# Patient Record
Sex: Male | Born: 1955 | Race: Black or African American | Hispanic: No | Marital: Married | Smoking: Former smoker
Health system: Southern US, Community
[De-identification: ages and names within clinical notes are randomized; demographics above are authoritative.]

## PROBLEM LIST (undated history)

## (undated) DIAGNOSIS — F29 Unspecified psychosis not due to a substance or known physiological condition: Secondary | ICD-10-CM

## (undated) DIAGNOSIS — R32 Unspecified urinary incontinence: Secondary | ICD-10-CM

## (undated) DIAGNOSIS — I10 Essential (primary) hypertension: Secondary | ICD-10-CM

## (undated) DIAGNOSIS — R569 Unspecified convulsions: Secondary | ICD-10-CM

---

## 2004-05-13 ENCOUNTER — Ambulatory Visit (HOSPITAL_COMMUNITY): Admission: RE | Admit: 2004-05-13 | Discharge: 2004-05-13 | Payer: Self-pay | Admitting: *Deleted

## 2008-07-29 ENCOUNTER — Ambulatory Visit (HOSPITAL_COMMUNITY): Admission: RE | Admit: 2008-07-29 | Discharge: 2008-07-29 | Payer: Self-pay | Admitting: Pulmonary Disease

## 2011-02-12 NOTE — Op Note (Signed)
NAME:  ALEISTER, LADY                         ACCOUNT NO.:  [id number]   MEDICAL RECORD NO.:  [id number]                   PATIENT TYPE:  AMB   LOCATION:  DAY                                  FACILITY:  [facility]   PHYSICIAN:  Dalia Heading, M.D.               DATE OF BIRTH:  12/28/1956   DATE OF PROCEDURE:  05/13/2004  DATE OF DISCHARGE:                                 OPERATIVE REPORT   PREOPERATIVE DIAGNOSIS:  Mass, right buttock.   POSTOPERATIVE DIAGNOSIS:  Mass, right buttock.   PROCEDURE:  Excision of mass, right buttock.   SURGEON:  Dalia Heading, M.D.   ANESTHESIA:  Local with IV sedation.   INDICATIONS FOR PROCEDURE:  The patient is a 55 year old black male with a  history of mental retardation and seizures who presents with an exophytic  mass on the right buttock.  He now comes to the operating room for excision  of the mass.  The risks and benefits of the procedure were fully explained  to the patient and caregiver, who gave informed consent for the patient.   DESCRIPTION OF PROCEDURE:  The patient was placed in the left lateral  decubitus position.  Versed 2 mg were given preoperatively for mild  sedation.  Xylocaine 1% was used for local anesthesia.  The right buttock  was prepped and draped using the usual sterile technique with Betadine.  Surgical site confirmation was performed.   An elliptical incision was made around the base of the mass.  This was taken  down to the subcutaneous tissue.  A lipomatous mass was found.  This was  sent to pathology for further evaluation.  Any bleeding was controlled using  Bovie electrocautery.  The subcutaneous layer was reapproximated using a 4-0  Vicryl interrupted suture.  The skin was closed using a 4-0 Prolene  interrupted suture.  Betadine ointment and a dry sterile dressing were  applied.   All tape and needle counts were correct at the end of the procedure.  The  patient was transferred to the PACU in stable  condition.   COMPLICATIONS:  None.   SPECIMENS:  Mass, right buttock.   ESTIMATED BLOOD LOSS:  Minimal.      ___________________________________________                                            Dalia Heading, M.D.   MAJ/MEDQ  D:  05/13/2004  T:  05/13/2004  Job:  [id number]   cc:   Ramon Dredge L. Juanetta Gosling, M.D.  [address]  [place]  [place] [address]  Fax: [phone]

## 2015-09-09 ENCOUNTER — Observation Stay (HOSPITAL_COMMUNITY)
Admission: EM | Admit: 2015-09-09 | Discharge: 2015-09-11 | Disposition: A | Discharge status: Home / Self Care | Payer: Medicaid Other | Attending: Pulmonary Disease | Admitting: Pulmonary Disease

## 2015-09-09 ENCOUNTER — Encounter (HOSPITAL_COMMUNITY): Payer: Self-pay

## 2015-09-09 ENCOUNTER — Emergency Department (HOSPITAL_COMMUNITY): Payer: Medicaid Other

## 2015-09-09 DIAGNOSIS — Z87891 Personal history of nicotine dependence: Secondary | ICD-10-CM | POA: Insufficient documentation

## 2015-09-09 DIAGNOSIS — R29810 Facial weakness: Secondary | ICD-10-CM | POA: Diagnosis not present

## 2015-09-09 DIAGNOSIS — R4182 Altered mental status, unspecified: Secondary | ICD-10-CM | POA: Diagnosis present

## 2015-09-09 DIAGNOSIS — R569 Unspecified convulsions: Secondary | ICD-10-CM

## 2015-09-09 DIAGNOSIS — G459 Transient cerebral ischemic attack, unspecified: Secondary | ICD-10-CM | POA: Diagnosis not present

## 2015-09-09 DIAGNOSIS — R32 Unspecified urinary incontinence: Secondary | ICD-10-CM

## 2015-09-09 DIAGNOSIS — N39498 Other specified urinary incontinence: Secondary | ICD-10-CM | POA: Diagnosis not present

## 2015-09-09 DIAGNOSIS — F29 Unspecified psychosis not due to a substance or known physiological condition: Secondary | ICD-10-CM | POA: Diagnosis not present

## 2015-09-09 DIAGNOSIS — Z79899 Other long term (current) drug therapy: Secondary | ICD-10-CM | POA: Diagnosis not present

## 2015-09-09 DIAGNOSIS — I10 Essential (primary) hypertension: Secondary | ICD-10-CM

## 2015-09-09 HISTORY — DX: Unspecified convulsions: R56.9

## 2015-09-09 HISTORY — DX: Unspecified psychosis not due to a substance or known physiological condition: F29

## 2015-09-09 HISTORY — DX: Unspecified urinary incontinence: R32

## 2015-09-09 HISTORY — DX: Essential (primary) hypertension: I10

## 2015-09-09 LAB — COMPREHENSIVE METABOLIC PANEL
ALBUMIN: 3.6 g/dL (ref 3.5–5.0)
ALT: 23 U/L (ref 17–63)
ANION GAP: 13 (ref 5–15)
AST: 28 U/L (ref 15–41)
Alkaline Phosphatase: 108 U/L (ref 38–126)
BUN: 11 mg/dL (ref 6–20)
CHLORIDE: 106 mmol/L (ref 101–111)
CO2: 23 mmol/L (ref 22–32)
CREATININE: 1.38 mg/dL — AB (ref 0.61–1.24)
Calcium: 8.4 mg/dL — ABNORMAL LOW (ref 8.9–10.3)
GFR calc non Af Amer: 54 mL/min — ABNORMAL LOW (ref >60)
Glucose, Bld: 135 mg/dL — ABNORMAL HIGH (ref 65–99)
Potassium: 3.6 mmol/L (ref 3.5–5.1)
SODIUM: 142 mmol/L (ref 135–145)
Total Bilirubin: 0.5 mg/dL (ref 0.3–1.2)
Total Protein: 7.1 g/dL (ref 6.5–8.1)

## 2015-09-09 LAB — URINALYSIS, ROUTINE W REFLEX MICROSCOPIC
Bilirubin Urine: NEGATIVE
GLUCOSE, UA: NEGATIVE mg/dL
HGB URINE DIPSTICK: NEGATIVE
KETONES UR: NEGATIVE mg/dL
Leukocytes, UA: NEGATIVE
Nitrite: NEGATIVE
PROTEIN: NEGATIVE mg/dL
Specific Gravity, Urine: <1.005 — ABNORMAL LOW (ref 1.005–1.030)
pH: 5.5 (ref 5.0–8.0)

## 2015-09-09 LAB — CBC
HCT: 41.1 % (ref 39.0–52.0)
Hemoglobin: 13.7 g/dL (ref 13.0–17.0)
MCH: 31.4 pg (ref 26.0–34.0)
MCHC: 33.3 g/dL (ref 30.0–36.0)
MCV: 94.1 fL (ref 78.0–100.0)
PLATELETS: 152 10*3/uL (ref 150–400)
RBC: 4.37 MIL/uL (ref 4.22–5.81)
RDW: 12.9 % (ref 11.5–15.5)
WBC: 12.3 10*3/uL — AB (ref 4.0–10.5)

## 2015-09-09 LAB — DIFFERENTIAL
BASOS PCT: 0 %
Basophils Absolute: 0 10*3/uL (ref 0.0–0.1)
Eosinophils Absolute: 0.1 10*3/uL (ref 0.0–0.7)
Eosinophils Relative: 1 %
Lymphocytes Relative: 10 %
Lymphs Abs: 1.3 10*3/uL (ref 0.7–4.0)
MONO ABS: 0.5 10*3/uL (ref 0.1–1.0)
Monocytes Relative: 4 %
NEUTROS ABS: 10.4 10*3/uL — AB (ref 1.7–7.7)
NEUTROS PCT: 85 %

## 2015-09-09 LAB — APTT: APTT: 22 s — AB (ref 24–37)

## 2015-09-09 LAB — AMMONIA: Ammonia: 31 umol/L (ref 9–35)

## 2015-09-09 LAB — RAPID URINE DRUG SCREEN, HOSP PERFORMED
Amphetamines: NOT DETECTED
BARBITURATES: NOT DETECTED
BENZODIAZEPINES: NOT DETECTED
COCAINE: NOT DETECTED
Opiates: NOT DETECTED
TETRAHYDROCANNABINOL: NOT DETECTED

## 2015-09-09 LAB — ETHANOL

## 2015-09-09 LAB — PROTIME-INR
INR: 1.17 (ref 0.00–1.49)
PROTHROMBIN TIME: 15.1 s (ref 11.6–15.2)

## 2015-09-09 MED ORDER — STROKE: EARLY STAGES OF RECOVERY BOOK
Freq: Once | Status: AC
  Administered 2015-09-10: 11:00:00
  Filled 2015-09-09: qty 1

## 2015-09-09 MED ORDER — SODIUM CHLORIDE 0.9 % IV SOLN
Freq: Once | INTRAVENOUS | Status: AC
Start: 2015-09-09 — End: 2015-09-09
  Administered 2015-09-09: 16:00:00 via INTRAVENOUS

## 2015-09-09 MED ORDER — ENOXAPARIN SODIUM 40 MG/0.4ML ~~LOC~~ SOLN
40.0000 mg | SUBCUTANEOUS | Status: DC
  Administered 2015-09-10 – 2015-09-11 (×2): 40 mg via SUBCUTANEOUS
  Filled 2015-09-09 (×2): qty 0.4

## 2015-09-09 MED ORDER — ASPIRIN 325 MG PO TABS
325.0000 mg | ORAL_TABLET | Freq: Every day | ORAL | Status: DC
  Administered 2015-09-10 – 2015-09-11 (×2): 325 mg via ORAL
  Filled 2015-09-09 (×2): qty 1

## 2015-09-09 MED ORDER — ASPIRIN 300 MG RE SUPP: 300.0000 mg | Freq: Every day | RECTAL | Status: DC

## 2015-09-09 NOTE — ED Notes (Signed)
Per ems cbg was 156.  Dr. Hyacinth MeekerMiller at bedside.

## 2015-09-09 NOTE — ED Provider Notes (Signed)
CSN: [id number]     Arrival date & time 09/09/15  1522 History   First MD Initiated Contact with Patient 09/09/15 1531     Chief Complaint  Patient presents with  . Altered Mental Status     (Consider location/radiation/quality/duration/timing/severity/associated sxs/prior Treatment) HPI Comments: The patient is a 59 year old male, he has a history of high blood pressure, psychosis, convulsions, he currently lives in an assisted care facility where staff reported that he had onset of facial droop and speaking softer than normal. They noticed this for the first time one hour ago, when the paramedics arrived they found the patient slumped over to his side Ryan Morton Fus appeared to have some facial droop however when I asked him to get up the facial droop immediately disappeared. He was unable to ambulate because he was off balance, the staff states that is his baseline, they also reported that the speech being slurred is his baseline. There has been no other abnormal findings per the staff including fevers, vomiting, coughing and the patient has no complaints. Paramedics report a blood sugar of 156. The symptoms occurred just prior to arrival, they were persistent, they resolved spontaneously, there were not associated with any other weakness numbness or seizures.  The history is provided by the patient and the EMS personnel.    Past Medical History  Diagnosis Date  . Hypertension   . Convulsions (HCC)   . Psychosis   . Urinary incontinence    History reviewed. No pertinent past surgical history. No family history on file. Social History  Substance Use Topics  . Smoking status: Former Games developer  . Smokeless tobacco: None  . Alcohol Use: No     Comment: former    Review of Systems  All other systems reviewed and are negative.     Allergies  Review of patient's allergies indicates no known allergies.  Home Medications   Prior to Admission medications   Medication Sig Start Date End  Date Taking? Authorizing Provider  amLODipine (NORVASC) 10 MG tablet Take 10 mg by mouth daily.   Yes Historical Provider, MD  ammonium lactate (LAC-HYDRIN) 12 % lotion Apply 1 application topically as needed for dry skin.   Yes Historical Provider, MD  atenolol (TENORMIN) 100 MG tablet Take 100 mg by mouth daily.   Yes Historical Provider, MD  benztropine (COGENTIN) 1 MG tablet Take 1 mg by mouth 2 (two) times daily.   Yes Historical Provider, MD  carbamazepine (TEGRETOL XR) 400 MG 12 hr tablet Take 400 mg by mouth 2 (two) times daily.   Yes Historical Provider, MD  furosemide (LASIX) 40 MG tablet Take 40 mg by mouth daily.   Yes Historical Provider, MD  lisinopril (PRINIVIL,ZESTRIL) 40 MG tablet Take 40 mg by mouth daily.   Yes Historical Provider, MD  OLANZapine (ZYPREXA) 5 MG tablet Take 5 mg by mouth daily.   Yes Historical Provider, MD  oxybutynin (DITROPAN) 5 MG tablet Take 5 mg by mouth 2 (two) times daily.   Yes Historical Provider, MD  pantoprazole (PROTONIX) 40 MG tablet Take 40 mg by mouth daily.   Yes Historical Provider, MD  potassium chloride (K-DUR) 10 MEQ tablet Take 20 mEq by mouth 4 (four) times daily.   Yes Historical Provider, MD   BP 133/85 mmHg  Pulse 77  Temp(Src) 98.4 F (36.9 C) (Oral)  Resp 18  Ht  (1.753 m)  SpO2 93% Physical Exam  Constitutional: He appears well-developed and well-nourished. No distress.  HENT:  Head:  Normocephalic and atraumatic.  Mouth/Throat: Oropharynx is clear and moist. No oropharyngeal exudate.  Dry mouth  Eyes: Conjunctivae and EOM are normal. Pupils are equal, round, and reactive to light. Right eye exhibits no discharge. Left eye exhibits no discharge. No scleral icterus.  Neck: Normal range of motion. Neck supple. No JVD present. No thyromegaly present.  Cardiovascular: Normal rate, regular rhythm, normal heart sounds and intact distal pulses.  Exam reveals no gallop and no friction rub.   No murmur heard. Pulmonary/Chest:  Effort normal and breath sounds normal. No respiratory distress. He has no wheezes. He has no rales.  Abdominal: Soft. Bowel sounds are normal. He exhibits no distension and no mass. There is no tenderness.  Musculoskeletal: Normal range of motion. He exhibits no edema or tenderness.  Lymphadenopathy:    He has no cervical adenopathy.  Neurological: He is alert. Coordination normal.  The patient has asterixis, he is slurring his speech, he has no facial droop, he is able to follow commands, has frequent convulsive myoclonic jerking movements of the upper extremities when he tries to use them, cranial nerves III through XII otherwise intact  Skin: Skin is warm and dry. No rash noted. No erythema.  Psychiatric: He has a normal mood and affect. His behavior is normal.  Nursing note and vitals reviewed.   ED Course  Procedures (including critical care time) Labs Review Labs Reviewed  APTT - Abnormal; Notable for the following:    aPTT 22 (*)    All other components within normal limits  CBC - Abnormal; Notable for the following:    WBC 12.3 (*)    All other components within normal limits  DIFFERENTIAL - Abnormal; Notable for the following:    Neutro Abs 10.4 (*)    All other components within normal limits  COMPREHENSIVE METABOLIC PANEL - Abnormal; Notable for the following:    Glucose, Bld 135 (*)    Creatinine, Ser 1.38 (*)    Calcium 8.4 (*)    GFR calc non Af Amer 54 (*)    All other components within normal limits  URINALYSIS, ROUTINE W REFLEX MICROSCOPIC (NOT AT [facility]) - Abnormal; Notable for the following:    Specific Gravity, Urine <1.005 (*)    All other components within normal limits  ETHANOL  PROTIME-INR  URINE RAPID DRUG SCREEN, HOSP PERFORMED  AMMONIA  I-STAT CHEM 8, ED  I-STAT TROPOININ, ED    Imaging Review Ct Head Wo Contrast  09/09/2015  CLINICAL DATA:  Left-sided facial droop beginning at 2:45 p.m. today. Slurred speech EXAM: CT HEAD WITHOUT CONTRAST  TECHNIQUE: Contiguous axial images were obtained from the base of the skull through the vertex without intravenous contrast. COMPARISON:  None. FINDINGS: Moderate cerebellar atrophy is present. Left temporal encephalomalacia is noted. The study is mildly degraded by patient motion. Mild subcortical white matter hypoattenuation is present. The basal ganglia and insular ribbon is intact bilaterally. No acute infarct, hemorrhage, or mass lesion is present. The ventricles are of normal size. No significant extra-axial fluid collection is present. The paranasal sinuses and mastoid air cells are clear. The calvarium is intact. IMPRESSION: 1. Moderate cerebellar atrophy. An this is most commonly seen in setting of chronic anti epileptic therapy. Alcohol abuse can give a similar appearance. 2. 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 Electronically Signed   By: Marin Robertshristopher  Mattern M.D.   On: 09/09/2015 17:04   I have personally reviewed and evaluated these images  and lab results as part of my medical decision-making.   EKG Interpretation   Date/Time:  Tuesday September 09 2015 15:44:41 EST Ventricular Rate:  79 PR Interval:  221 QRS Duration: 123 QT Interval:  381 QTC Calculation: 437 R Axis:   33 Text Interpretation:  Sinus rhythm Prolonged PR interval Nonspecific  intraventricular conduction delay Inferior infarct, old No old tracing to  compare Confirmed by Justice Milliron  MD, Madissen Wyse ([id number]) on 09/09/2015 4:13:19 PM      MDM   Final diagnoses:  None    We'll discuss with nursing facility regarding the patient's baseline though it appears that many of the things we are seeing today are his baseline. He does appear dehydrated that this could be related to his medications as he is on antipsychotics and an antihistamine both of which can cause anticholinergism. We'll proceed with CT scan of the brain, labs, IV fluids.  According to staff, he can't feed  himself, dress himself and is total care - at 1:30 - they found him slumped over in a chair.  D/w Neurologist Dr. Gerilyn Pilgrim - reccomends admission D/w hospitalist who will admit  n obovious findings on imaging / labs.  Eber Hong, MD 09/09/15 [phone]

## 2015-09-09 NOTE — H&P (Signed)
Patient Demographics  Ryan Morton, is a 59 y.o. male  MRN: [id number]   DOB - 01-07-1956  Admit Date - 09/09/2015  Outpatient Primary MD for the patient is No primary care provider on file.   With History of -  Past Medical History  Diagnosis Date  . Hypertension   . Convulsions (HCC)   . Psychosis   . Urinary incontinence       History reviewed. No pertinent past surgical history.  in for   Chief Complaint  Patient presents with  . Altered Mental Status     HPI  Ryan Morton  is a 59 y.o. 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 as per caregiver), lives at the group home, called to evaluate patient for an episode of left facial droop, and altered mental status lasted for 30 minutes to 1 hour, caregiver at bedside who is taking care of patient last 16 years, report patient is wheelchair dependent, with incontinence, had an episode where he was sitting on the chair, noticed to have left facial droop, was delayed in answering questions, and appears to be confused, symptoms resolved by time patient was in ED, no evidence of upper or lower extremity weakness, patient denies tingling or numbness, no seizure-like activity noticed, no urinary or stool incontinence, CBG on presentation 156, CT head with no evidence of stroke, a physician discussed with the neurology, commended patient to be admitted for TIA workup, I was called to admit.    Review of Systems    In addition to the HPI above,  No Fever-chills, No Headache, No changes with Vision or hearing, No problems swallowing food or Liquids, No Chest pain, Cough or Shortness of Breath, No Abdominal pain, No Nausea or Vommitting, Bowel movements are regular, No Blood in stool or Urine, No dysuria, No new skin rashes or bruises, No new joints pains-aches,  No new weakness, tingling,  numbness in any extremity, patient was noticed to have left facial droop by staff. No recent weight gain or loss, No polyuria, polydypsia or polyphagia, No significant Mental Stressors.  A full 10 point Review of Systems was done, except as stated above, all other Review of Systems were negative.   Social History Social History  Substance Use Topics  . Smoking status: Former Games developer  . Smokeless tobacco: Not on file  . Alcohol Use: No     Comment: former     Family History Unable to obtain, as patient can't recall  Prior to Admission medications   Medication Sig Start Date End Date Taking? Authorizing Provider  amLODipine (NORVASC) 10 MG tablet Take 10 mg by mouth daily.   Yes Historical Provider, MD  ammonium lactate (LAC-HYDRIN) 12 % lotion Apply 1 application topically as needed for dry skin.   Yes Historical Provider, MD  atenolol (TENORMIN) 100 MG tablet Take 100 mg by mouth daily.   Yes Historical Provider, MD  benztropine (COGENTIN) 1 MG tablet Take 1 mg by mouth 2 (two) times daily.   Yes Historical Provider, MD  carbamazepine (TEGRETOL XR) 400 MG 12 hr tablet Take 400 mg by mouth 2 (two) times daily.   Yes Historical Provider, MD  furosemide (LASIX) 40 MG tablet Take 40 mg by mouth daily.   Yes Historical Provider, MD  lisinopril (PRINIVIL,ZESTRIL) 40 MG tablet Take 40 mg by mouth daily.   Yes Historical Provider, MD  OLANZapine (ZYPREXA) 5 MG  tablet Take 5 mg by mouth daily.   Yes Historical Provider, MD  oxybutynin (DITROPAN) 5 MG tablet Take 5 mg by mouth 2 (two) times daily.   Yes Historical Provider, MD  pantoprazole (PROTONIX) 40 MG tablet Take 40 mg by mouth daily.   Yes Historical Provider, MD  potassium chloride (K-DUR) 10 MEQ tablet Take 20 mEq by mouth 4 (four) times daily.   Yes Historical Provider, MD    No Known Allergies  Physical Exam  Vitals  Blood pressure 129/86, pulse 83, temperature 98.5 F (36.9 C), temperature source Oral, resp. rate 20,  height 5\' 9"  (1.753 m), SpO2 98 %.   1. General well-developed male lying in bed in NAD,    2. Normal affect,   Awake Alert, degree of cognitive impairment.  3. No F.N deficits, ALL C.Nerves Intact, no gross deficits, bilateral lower extremity generally weak but nonfocal, Sensation intact all 4 extremities, Plantars down going.  4. Ears and Eyes appear Normal, Conjunctivae clear, PERRLA. Moist Oral Mucosa.  5. Supple Neck, No JVD, No cervical lymphadenopathy appriciated, No Carotid Bruits.  6. Symmetrical Chest wall movement, Good air movement bilaterally, CTAB.  7. RRR, No Gallops, Rubs or Murmurs, No Parasternal Heave.  8. Positive Bowel Sounds, Abdomen Soft, No tenderness, No organomegaly appriciated,No rebound -guarding or rigidity.  9.  No Cyanosis, Normal Skin Turgor, No Skin Rash or Bruise.  10. Good muscle tone,  joints appear normal , no effusions, Normal ROM.  11. No Palpable Lymph Nodes in Neck or Axillae    Data Review  CBC  Recent Labs Lab 09/09/15 1633  WBC 12.3*  HGB 13.7  HCT 41.1  PLT 152  MCV 94.1  MCH 31.4  MCHC 33.3  RDW 12.9  LYMPHSABS 1.3  MONOABS 0.5  EOSABS 0.1  BASOSABS 0.0   ------------------------------------------------------------------------------------------------------------------  Chemistries   Recent Labs Lab 09/09/15 1633  NA 142  K 3.6  CL 106  CO2 23  GLUCOSE 135*  BUN 11  CREATININE 1.38*  CALCIUM 8.4*  AST 28  ALT 23  ALKPHOS 108  BILITOT 0.5   ------------------------------------------------------------------------------------------------------------------ CrCl cannot be calculated (Unknown ideal weight.). ------------------------------------------------------------------------------------------------------------------ No results for input(s): TSH, T4TOTAL, T3FREE, THYROIDAB in the last 72 hours.  Invalid input(s): FREET3   Coagulation profile  Recent Labs Lab 09/09/15 1633  INR 1.17    ------------------------------------------------------------------------------------------------------------------- No results for input(s): DDIMER in the last 72 hours. -------------------------------------------------------------------------------------------------------------------  Cardiac Enzymes No results for input(s): CKMB, TROPONINI, MYOGLOBIN in the last 168 hours.  Invalid input(s): CK ------------------------------------------------------------------------------------------------------------------ Invalid input(s): POCBNP   ---------------------------------------------------------------------------------------------------------------  Urinalysis    Component Value Date/Time   COLORURINE YELLOW 09/09/2015 1600   APPEARANCEUR CLEAR 09/09/2015 1600   LABSPEC <1.005* 09/09/2015 1600   PHURINE 5.5 09/09/2015 1600   GLUCOSEU NEGATIVE 09/09/2015 1600   HGBUR NEGATIVE 09/09/2015 1600   BILIRUBINUR NEGATIVE 09/09/2015 1600   KETONESUR NEGATIVE 09/09/2015 1600   PROTEINUR NEGATIVE 09/09/2015 1600   NITRITE NEGATIVE 09/09/2015 1600   LEUKOCYTESUR NEGATIVE 09/09/2015 1600    ----------------------------------------------------------------------------------------------------------------  Imaging results:   Ct Head Wo Contrast  09/09/2015  CLINICAL DATA:  Left-sided facial droop beginning at 2:45 p.m. today. Slurred speech EXAM: CT HEAD WITHOUT CONTRAST TECHNIQUE: Contiguous axial images were obtained from the base of the skull through the vertex without intravenous contrast. COMPARISON:  None. FINDINGS: Moderate cerebellar atrophy is present. Left temporal encephalomalacia is noted. The study is mildly degraded by patient motion. Mild subcortical white matter hypoattenuation is present. The basal ganglia  and insular ribbon is intact bilaterally. No acute infarct, hemorrhage, or mass lesion is present. The ventricles are of normal size. No significant extra-axial fluid  collection is present. The paranasal sinuses and mastoid air cells are clear. The calvarium is intact. IMPRESSION: 1. Moderate cerebellar atrophy. An this is most commonly seen in setting of chronic anti epileptic therapy. Alcohol abuse can give a similar appearance. 2. 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 Electronically Signed   By: Marin Roberts M.D.   On: 09/09/2015 17:04    My personal review of EKG: Rhythm NSR, Rate  79 /min, QTc 437 , no Acute ST changes    Assessment & Plan  Active Problems:   Facial droop   Hypertension   Urinary incontinence   Convulsion (HCC)  Episode of facial droop/altered mental status - Resolved, with no evidence of acute CVA on CT head, patient will be admitted for further workup for TIA, monitor on telemetry, will check 2-D echo, carotid Dopplers, lipid panel, patient passed bedside swallow evaluation in ED, will start on aspirin. - Neurology consulted  Hypertension - Pressure acceptable, continue with home medication  Urinary Incontinence - Continue with oxybutynin  Convulsions - Continue with home medications, Asian presentation does not resemble seizures activity.  Psychosis - Continue home medication   DVT Prophylaxis   Lovenox  AM Labs Ordered, also please review Full Orders  Family Communication: Admission, patients condition and plan of care including tests being ordered have been discussed with the patient and caregiver who indicate understanding and agree with the plan and Code Status.  Code Status Full code  Likely DC to  Group home Condition GUARDED   Time spent in minutes : 50 minutes    Margene Cherian M.D on 09/09/2015 at 6:36 PM  Between 7am to 7pm - Pager - [phone]  After 7pm go to [email or web address] - password [email or web address]  And look for the night coverage person covering me after hours  [facility] Office  [phone]

## 2015-09-09 NOTE — ED Notes (Signed)
Attempting to call report. Nurse unable to take report at this time

## 2015-09-09 NOTE — ED Notes (Signed)
Nurse in report unable to take report

## 2015-09-09 NOTE — ED Notes (Signed)
300 Charge nurse called back stating no more telemetry beds on 300. Chales AbrahamsMary Ann notified nursing supervisor. ED charge nurse notified

## 2015-09-09 NOTE — ED Notes (Signed)
Pt resident of [facility] assisted [facility].  EMS reports was called out for possible stroke, staff reports they noticed a left sided facial droop that started 1 hour ago.  upon their arrival, pt was slumped in chair leaning to the left.  EMS says when pt sat up straight, facial droop improved.  EMS says rest of stroke screen was negative.

## 2015-09-10 ENCOUNTER — Observation Stay (HOSPITAL_COMMUNITY): Payer: Medicaid Other

## 2015-09-10 ENCOUNTER — Observation Stay (HOSPITAL_BASED_OUTPATIENT_CLINIC_OR_DEPARTMENT_OTHER): Payer: Medicaid Other

## 2015-09-10 DIAGNOSIS — G459 Transient cerebral ischemic attack, unspecified: Secondary | ICD-10-CM

## 2015-09-10 LAB — LIPID PANEL
CHOL/HDL RATIO: 6.8 ratio
Cholesterol: 203 mg/dL — ABNORMAL HIGH (ref 0–200)
HDL: 30 mg/dL — ABNORMAL LOW (ref >40)
LDL CALC: 120 mg/dL — AB (ref 0–99)
TRIGLYCERIDES: 264 mg/dL — AB (ref <150)
VLDL: 53 mg/dL — AB (ref 0–40)

## 2015-09-10 LAB — CARBAMAZEPINE LEVEL, TOTAL: Carbamazepine Lvl: 4.8 ug/mL (ref 4.0–12.0)

## 2015-09-10 LAB — VITAMIN B12: VITAMIN B 12: 410 pg/mL (ref 180–914)

## 2015-09-10 LAB — TSH: TSH: 0.564 u[IU]/mL (ref 0.350–4.500)

## 2015-09-10 NOTE — Progress Notes (Signed)
Subjective: He was admitted with change in his facial appearance. He is apparently better now. TIA workup is underway.  Objective: Vital signs in last 24 hours: Temp:  [97.1 F (36.2 C)-98.5 F (36.9 C)] 97.7 F (36.5 C) (12/14 0520) Pulse Rate:  [63-89] 63 (12/14 0520) Resp:  [18-21] 18 (12/14 0520) BP: (106-141)/(77-99) 119/77 mmHg (12/14 0520) SpO2:  [93 %-100 %] 94 % (12/14 0520) Weight:  [97.1 kg (214 lb 1.1 oz)] 97.1 kg (214 lb 1.1 oz) (12/14 0000) Weight change:  Last BM Date: 09/07/15  Intake/Output from previous day:    PHYSICAL EXAM General appearance: Sleepy which is his normal situation Resp: clear to auscultation bilaterally Cardio: regular rate and rhythm, S1, S2 normal, no murmur, click, rub or gallop GI: soft, non-tender; bowel sounds normal; no masses,  no organomegaly Extremities: extremities normal, atraumatic, no cyanosis or edema  Lab Results:  Results for orders placed or performed during the hospital encounter of 09/09/15 (from the past 48 hour(s))  Urine rapid drug screen (hosp performed)not at [facility]     Status: None   Collection Time: 09/09/15  4:00 PM  Result Value Ref Range   Opiates NONE DETECTED NONE DETECTED   Cocaine NONE DETECTED NONE DETECTED   Benzodiazepines NONE DETECTED NONE DETECTED   Amphetamines NONE DETECTED NONE DETECTED   Tetrahydrocannabinol NONE DETECTED NONE DETECTED   Barbiturates NONE DETECTED NONE DETECTED    Comment:        DRUG SCREEN FOR MEDICAL PURPOSES ONLY.  IF CONFIRMATION IS NEEDED FOR ANY PURPOSE, NOTIFY LAB WITHIN 5 DAYS.        LOWEST DETECTABLE LIMITS FOR URINE DRUG SCREEN Drug Class       Cutoff (ng/mL) Amphetamine      1000 Barbiturate      200 Benzodiazepine   161 Tricyclics       096 Opiates          300 Cocaine          300 THC              50   Urinalysis, Routine w reflex microscopic (not at [facility])     Status: Abnormal   Collection Time: 09/09/15  4:00 PM  Result Value Ref Range   Color, Urine  YELLOW YELLOW   APPearance CLEAR CLEAR   Specific Gravity, Urine <1.005 (L) 1.005 - 1.030   pH 5.5 5.0 - 8.0   Glucose, UA NEGATIVE NEGATIVE mg/dL   Hgb urine dipstick NEGATIVE NEGATIVE   Bilirubin Urine NEGATIVE NEGATIVE   Ketones, ur NEGATIVE NEGATIVE mg/dL   Protein, ur NEGATIVE NEGATIVE mg/dL   Nitrite NEGATIVE NEGATIVE   Leukocytes, UA NEGATIVE NEGATIVE    Comment: MICROSCOPIC NOT DONE ON URINES WITH NEGATIVE PROTEIN, BLOOD, LEUKOCYTES, NITRITE, OR GLUCOSE <1000 mg/dL.  Ethanol     Status: None   Collection Time: 09/09/15  4:33 PM  Result Value Ref Range   Alcohol, Ethyl (B) <5 <5 mg/dL    Comment:        LOWEST DETECTABLE LIMIT FOR SERUM ALCOHOL IS 5 mg/dL FOR MEDICAL PURPOSES ONLY   Protime-INR     Status: None   Collection Time: 09/09/15  4:33 PM  Result Value Ref Range   Prothrombin Time 15.1 11.6 - 15.2 seconds   INR 1.17 0.00 - 1.49  APTT     Status: Abnormal   Collection Time: 09/09/15  4:33 PM  Result Value Ref Range   aPTT 22 (L) 24 - 37 seconds  CBC     Status: Abnormal   Collection Time: 09/09/15  4:33 PM  Result Value Ref Range   WBC 12.3 (H) 4.0 - 10.5 K/uL   RBC 4.37 4.22 - 5.81 MIL/uL   Hemoglobin 13.7 13.0 - 17.0 g/dL   HCT 41.1 39.0 - 52.0 %   MCV 94.1 78.0 - 100.0 fL   MCH 31.4 26.0 - 34.0 pg   MCHC 33.3 30.0 - 36.0 g/dL   RDW 12.9 11.5 - 15.5 %   Platelets 152 150 - 400 K/uL  Differential     Status: Abnormal   Collection Time: 09/09/15  4:33 PM  Result Value Ref Range   Neutrophils Relative % 85 %   Neutro Abs 10.4 (H) 1.7 - 7.7 K/uL   Lymphocytes Relative 10 %   Lymphs Abs 1.3 0.7 - 4.0 K/uL   Monocytes Relative 4 %   Monocytes Absolute 0.5 0.1 - 1.0 K/uL   Eosinophils Relative 1 %   Eosinophils Absolute 0.1 0.0 - 0.7 K/uL   Basophils Relative 0 %   Basophils Absolute 0.0 0.0 - 0.1 K/uL  Comprehensive metabolic panel     Status: Abnormal   Collection Time: 09/09/15  4:33 PM  Result Value Ref Range   Sodium 142 135 - 145 mmol/L    Potassium 3.6 3.5 - 5.1 mmol/L   Chloride 106 101 - 111 mmol/L   CO2 23 22 - 32 mmol/L   Glucose, Bld 135 (H) 65 - 99 mg/dL   BUN 11 6 - 20 mg/dL   Creatinine, Ser 1.38 (H) 0.61 - 1.24 mg/dL   Calcium 8.4 (L) 8.9 - 10.3 mg/dL   Total Protein 7.1 6.5 - 8.1 g/dL   Albumin 3.6 3.5 - 5.0 g/dL   AST 28 15 - 41 U/L   ALT 23 17 - 63 U/L   Alkaline Phosphatase 108 38 - 126 U/L   Total Bilirubin 0.5 0.3 - 1.2 mg/dL   GFR calc non Af Amer 54 (L) >60 mL/min   GFR calc Af Amer >60 >60 mL/min    Comment: (NOTE) The eGFR has been calculated using the CKD EPI equation. This calculation has not been validated in all clinical situations. eGFR's persistently <60 mL/min signify possible Chronic Kidney Disease.    Anion gap 13 5 - 15  Ammonia     Status: None   Collection Time: 09/09/15  4:33 PM  Result Value Ref Range   Ammonia 31 9 - 35 umol/L  Lipid panel     Status: Abnormal   Collection Time: 09/10/15  7:15 AM  Result Value Ref Range   Cholesterol 203 (H) 0 - 200 mg/dL   Triglycerides 264 (H) <150 mg/dL   HDL 30 (L) >40 mg/dL   Total CHOL/HDL Ratio 6.8 RATIO   VLDL 53 (H) 0 - 40 mg/dL   LDL Cholesterol 120 (H) 0 - 99 mg/dL    Comment:        Total Cholesterol/HDL:CHD Risk Coronary Heart Disease Risk Table                     Men   Women  1/2 Average Risk   3.4   3.3  Average Risk       5.0   4.4  2 X Average Risk   9.6   7.1  3 X Average Risk  23.4   11.0        Use the calculated Patient Ratio above and the  CHD Risk Table to determine the patient's CHD Risk.        ATP III CLASSIFICATION (LDL):  <100     mg/dL   Optimal  100-129  mg/dL   Near or Above                    Optimal  130-159  mg/dL   Borderline  160-189  mg/dL   High  >190     mg/dL   Very High     ABGS No results for input(s): PHART, PO2ART, TCO2, HCO3 in the last 72 hours.  Invalid input(s): PCO2 CULTURES No results found for this or any previous visit (from the past 240 hour(s)). Studies/Results: Ct  Head Wo Contrast  09/09/2015  CLINICAL DATA:  Left-sided facial droop beginning at 2:45 p.m. today. Slurred speech EXAM: CT HEAD WITHOUT CONTRAST TECHNIQUE: Contiguous axial images were obtained from the base of the skull through the vertex without intravenous contrast. COMPARISON:  None. FINDINGS: Moderate cerebellar atrophy is present. Left temporal encephalomalacia is noted. The study is mildly degraded by patient motion. Mild subcortical white matter hypoattenuation is present. The basal ganglia and insular ribbon is intact bilaterally. No acute infarct, hemorrhage, or mass lesion is present. The ventricles are of normal size. No significant extra-axial fluid collection is present. The paranasal sinuses and mastoid air cells are clear. The calvarium is intact. IMPRESSION: 1. Moderate cerebellar atrophy. An this is most commonly seen in setting of chronic anti epileptic therapy. Alcohol abuse can give a similar appearance. 2. 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 Electronically Signed   By: San Morelle M.D.   On: 09/09/2015 17:04    Medications:  Prior to Admission:  Prescriptions prior to admission  Medication Sig Dispense Refill Last Dose  . amLODipine (NORVASC) 10 MG tablet Take 10 mg by mouth daily.   09/09/2015 at Unknown time  . ammonium lactate (LAC-HYDRIN) 12 % lotion Apply 1 application topically as needed for dry skin.   09/09/2015 at Unknown time  . atenolol (TENORMIN) 100 MG tablet Take 100 mg by mouth daily.   09/09/2015 at 800A  . benztropine (COGENTIN) 1 MG tablet Take 1 mg by mouth 2 (two) times daily.   09/09/2015 at Unknown time  . carbamazepine (TEGRETOL XR) 400 MG 12 hr tablet Take 400 mg by mouth 2 (two) times daily.   09/09/2015 at 800A  . furosemide (LASIX) 40 MG tablet Take 40 mg by mouth daily.   09/09/2015 at Unknown time  . lisinopril (PRINIVIL,ZESTRIL) 40 MG tablet Take 40 mg by mouth daily.   09/09/2015  at Unknown time  . OLANZapine (ZYPREXA) 5 MG tablet Take 5 mg by mouth daily.   09/09/2015 at Unknown time  . oxybutynin (DITROPAN) 5 MG tablet Take 5 mg by mouth 2 (two) times daily.   09/09/2015 at Unknown time  . pantoprazole (PROTONIX) 40 MG tablet Take 40 mg by mouth daily.   09/09/2015 at Unknown time  . potassium chloride (K-DUR) 10 MEQ tablet Take 20 mEq by mouth 4 (four) times daily.   09/09/2015 at Unknown time   Scheduled: .  stroke: mapping our early stages of recovery book   Does not apply Once  . aspirin  300 mg Rectal Daily   Or  . aspirin  325 mg Oral Daily  . enoxaparin (LOVENOX) injection  40 mg Subcutaneous Q24H   Continuous:  PRN:  Assesment: He has had  possible TIA. This seems better but workup is underway. At baseline he has seizure disorder history of traumatic brain injury and trouble with his cognitive function because of that Active Problems:   Facial droop   Hypertension   Urinary incontinence   Convulsion ([place])    Plan: Echocardiogram, Doppler of the carotids, neurology consultation,      Alhaji Mcneal L 09/10/2015, 9:32 AM

## 2015-09-10 NOTE — Clinical Social Work Note (Signed)
Clinical Social Work Assessment  Patient Details  Name: Ryan Morton MRN: [id number] Date of Birth: 03/14/1956  Date of referral:  09/10/15               Reason for consult:  Facility Placement                Permission sought to share information with:    Permission granted to share information::     Name::        Agency::     Relationship::     Contact Information:     Housing/Transportation Living arrangements for the past 2 months:  [facility] Source of Information:  Facility, Patient (Sister, Ned ClinesMary Crump) Patient Interpreter Needed:  None Criminal Activity/Legal Involvement Pertinent to Current Situation/Hospitalization:  No - Comment as needed Significant Relationships:  Siblings Lives with:  Facility Resident Do you feel safe going back to the place where you live?  Yes Need for family participation in patient care:  Yes (Comment)  Care giving concerns:  None identified.   Social Worker assessment / plan:  CSW spoke with patient's sister, Ned ClinesMary Crump, who advised that patient has been a resident at [facility] for [facility].  She stated that he is wheelchair dependant and that she visits frequently. She indicated that she desires for patient to return to the facility at discharge.  CSW spoke with Effie ShyZack Martin, at [facility].  Mr. Daphine DeutscherMartin confirmed Ms. Crump's statements. He stated that patient has been at the facility since 2008.  He reported that patient can return to the facility at discharge.   Employment status:  Disabled (Comment on whether or not currently receiving Disability) Insurance information:  [place] PT Recommendations:  Not assessed at this time Information / Referral to community resources:     Patient/Family's Response to care: Family is agreeable for patient to return to the facility at discharge.   Patient/Family's Understanding of and Emotional Response to Diagnosis, Current Treatment, and Prognosis:  Patient is being assessed for  TIA, family is awaiting diagnosis information.   Emotional Assessment Appearance:  Appears stated age Attitude/Demeanor/Rapport:   (Cooperative) Affect (typically observed):  Calm Orientation:  Oriented to Self, Oriented to  Time (Patient knew he was in [place] but was unaware that he was in [facility] or why he was at [facility] Alcohol / Substance use:  Not Applicable Psych involvement (Current and /or in the community):  No (Comment)  Discharge Needs  Concerns to be addressed:  Discharge Planning Concerns Readmission within the last 30 days:  No Current discharge risk:  None Barriers to Discharge:  No Barriers Identified   Annice NeedySettle, Dejuana Weist D, LCSW 09/10/2015, 1:08 PM

## 2015-09-10 NOTE — Evaluation (Signed)
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 At baseline, pt presents with BLE weakness, incontinence, and WC for mobility. Pt comes to us from ALF where he has lived since 2008, after a sudden onset facial droop and AMS.   Clinical Impression  Pt received semirecumbent in bed, pleasant and agreeable to PT evaluation. Pt reports feeling well, no change from baseline, denies, dizziness, visual disturbance, lightheadedness, HA, somnolence, or difficulty concentrating. Pt reports he is having mild difficulty with speaking his words, but confirms that he is dysarthric at baseline. Light touch sensation is intact throughout the BLE, BUE and both sides of face, with some mild quality differences at the C5 dermatome. Pt strength in BUE is very strong with 20% greater strength on Left side, which is non dominant side. Pt presents with moderate dysmetria bilaterally which correlates well with chronic dysarthria and imaging studies this visit identifying gross cerebellar atrophy. In light of past medical history, it is not clear that patient is presenting with any acute impairment or dysfunction. PT is at baseline and appropriate to DC to ALF once deemed medically appropriate. PT signing off.     Follow Up Recommendations No PT follow up    Equipment Recommendations  None recommended by PT    Recommendations for Other Services       Precautions / Restrictions Precautions Precautions: Fall Restrictions Weight Bearing Restrictions: No      Mobility  Bed Mobility                  Transfers                    Ambulation/Gait                Stairs            Wheelchair Mobility    Modified Rankin (Stroke Patients Only)       Balance                                             Pertinent Vitals/Pain Pain Assessment: No/denies pain    Home Living Family/patient expects to be discharged to:: Assisted living               Home Equipment: Wheelchair - manual      Prior Function Level of Independence: Needs assistance   Gait / Transfers Assistance Needed: [facility]            Hand Dominance        Extremity/Trunk Assessment   Upper Extremity Assessment: Overall WFL for tasks assessed (At baseline: very strong, L>R, moderate dysmetria, light touch sensation intact except for the R C5 dermatome, which he says is minimally different in quality. )           Lower Extremity Assessment: Generalized weakness (Limited ability to perform AROM of BLE in bed, which he attests is baseline. Reports unchanged, intact sensation bilat, and heels with good skin integrity (floated subsequently) )         Communication   Communication: Expressive difficulties (moderate dysarthria at baseline. )  Cognition Arousal/Alertness: Awake/alert  Behavior During Therapy: WFL for tasks assessed/performed Overall Cognitive Status: History of cognitive impairments - at baseline                      General Comments      Exercises        Assessment/Plan    PT Assessment Patent does not need any further PT services  PT Diagnosis     PT Problem List    PT Treatment Interventions     PT Goals (Current goals can be found in the Care Plan section) Acute Rehab PT Goals PT Goal Formulation: All assessment and education complete, DC therapy    Frequency     Barriers to discharge        Co-evaluation               End of Session   Activity Tolerance: Patient tolerated treatment well;No increased pain Patient left: in bed;with call bell/phone within reach;with bed alarm set (explained call bell to patient. ) Nurse Communication: Other  (comment)         Time: 4098-1191 PT Time Calculation (min) (ACUTE ONLY): 13 min   Charges:   PT Evaluation $Initial PT Evaluation Tier I: 1 Procedure     PT G Codes:        Kamaal Cast C 09-24-15, 3:35 PM  3:41 PM  Rosamaria Lints, PT, DPT [place] License # [id number]

## 2015-09-10 NOTE — Evaluation (Addendum)
Clinical/Bedside Swallow Evaluation Patient Details  Name: Ryan Morton MRN: [id number] Date of Birth: 06/27/1956  Today's Date: 09/10/2015 Time: SLP Start Time (ACUTE ONLY): 1435 SLP Stop Time (ACUTE ONLY): 1456 SLP Time Calculation (min) (ACUTE ONLY): 21 min  Past Medical History:  Past Medical History  Diagnosis Date  . Hypertension   . Convulsions ([facility])   . Psychosis   . Urinary incontinence    Past Surgical History: History reviewed. No pertinent past surgical history. HPI:  Ryan Morton is a 59 y.o. 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 as per caregiver), lives at the group home, called to evaluate patient for an episode of left facial droop, and altered mental status. Pt with hx of swallow study in 2009 although report is not available. ST to evaluate current swallow fx.   Assessment / Plan / Recommendation Clinical Impression  Pt presented to the ED with a left facial droop; CT and MRI with no evidence of stroke, pt was admitted for TIA workup. Pt is very pleasant with cognitive deficits at baseline secondary to old TBI but with no difficulties following directions for Oral mech exam which was unremarkable with resolved facial symmetry; noted edentulous status. Pt presents with no overt s/sx of aspiration with any consistencies/textures presented. Recommend D3 (mech soft)/thin liquids and meds to be administered whole in puree. No further ST needs at this time, ST to sign off.    Aspiration Risk  Mild aspiration risk    Diet Recommendation Dysphagia 3 (Mech soft);Thin liquid   Liquid Administration via: Cup;Straw Medication Administration: Whole meds with liquid Supervision: Patient able to self feed;Staff to assist with self feeding Compensations: Follow solids with liquid Postural Changes: Seated upright at 90 degrees    Other   Recommendations Oral Care Recommendations: Oral care BID   Follow up Recommendations  None    Frequency and Duration            Prognosis Prognosis for Safe Diet Advancement: Good      Swallow Study   General Date of Onset: 09/09/15 HPI: Ryan Morton is a 59 y.o. 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 as per caregiver), lives at the group home, called to evaluate patient for an episode of left facial droop, and altered mental status. Pt with hx of swallow study in 2009 although report is not available. ST to evaluate current swallow fx. Type of Study: Bedside Swallow Evaluation Diet Prior to this Study: Information not available;NPO Temperature Spikes Noted: No Respiratory Status: Room air History of Recent Intubation: No Behavior/Cognition: Alert;Cooperative;Pleasant mood Oral Cavity Assessment: Within Functional Limits Oral Cavity - Dentition: Edentulous Vision: Functional for self-feeding Self-Feeding Abilities: Able to feed self;Needs assist;Needs set up Patient Positioning: Upright in bed Baseline Vocal Quality: Normal Volitional Cough: Strong Volitional Swallow: Able to elicit    Oral/Motor/Sensory Function Overall Oral Motor/Sensory Function: Within functional limits   Ice Chips Ice chips: Within functional limits   Thin Liquid Thin Liquid: Within functional limits    Nectar Thick Nectar Thick Liquid: Not tested   Honey Thick Honey Thick Liquid: Not tested   Puree Puree: Within functional limits   Solid Solid: Within functional limits       Ryan Derden H. Ryan Morton, CCC-SLP Speech Language Pathologist  Ryan HaberAmelia H Lea Morton 09/10/2015,3:24 PM
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
## 2015-09-10 NOTE — Progress Notes (Signed)
OT Cancellation Note  Patient Details Name: Ryan Morton MRN: [id number] DOB: 02/06/1956   Cancelled Treatment:     Reason evaluation not completed: Pt currently unavailable, procedure in room.   Ezra SitesLeslie Troxler, OTR/L  [phone]  09/10/2015, 9:05 AM

## 2015-09-11 LAB — MRSA PCR SCREENING: MRSA by PCR: NEGATIVE

## 2015-09-11 LAB — HEMOGLOBIN A1C
Hgb A1c MFr Bld: 6 % — ABNORMAL HIGH (ref 4.8–5.6)
Mean Plasma Glucose: 126 mg/dL

## 2015-09-11 MED ORDER — ASPIRIN 325 MG PO TABS: 325.0000 mg | ORAL_TABLET | Freq: Every day | ORAL | Status: DC

## 2015-09-11 MED ORDER — ATORVASTATIN CALCIUM 80 MG PO TABS: 80.0000 mg | ORAL_TABLET | Freq: Every day | ORAL | Status: AC

## 2015-09-11 NOTE — NC FL2 (Signed)
[place] MEDICAID FL2 LEVEL OF CARE SCREENING TOOL     IDENTIFICATION  Patient Name: Ryan Morton Birthdate: 10-15-1955 Sex: male Admission Date (Current Location): 09/09/2015  [address] and [place] Number:   [id number] Facility and Address:  [facility],  [address] [place] [address]      Provider Number: [id number]  Attending Physician Name and Address:  Kari Baars, MD  Relative Name and Phone Number:       Current Level of Care: Hospital Recommended Level of Care: Family Care Home [facility]) Prior Approval Number:    Date Approved/Denied:   PASRR Number:    Discharge Plan: Other (Comment) [facility])    Current Diagnoses: Patient Active Problem List   Diagnosis Date Noted  . Facial droop 09/09/2015  . Hypertension 09/09/2015  . Urinary incontinence 09/09/2015  . Convulsion (HCC) 09/09/2015    Orientation RESPIRATION BLADDER Height & Weight    Self, Place, Time (Patient did know the month and the date. He did not know the year.  He knew he was in [place], but di d not know that he was in [facility] nor the reason for being at [facility].)  Normal Incontinent  (175.3 cm) 214 lbs.  BEHAVIORAL SYMPTOMS/MOOD NEUROLOGICAL BOWEL NUTRITION STATUS    Convulsions/Seizures Incontinent Diet  AMBULATORY STATUS COMMUNICATION OF NEEDS Skin   Total Care (Patient uses a wheel chair. ) Verbally Normal                       Personal Care Assistance Level of Assistance  Bathing, Feeding, Dressing Bathing Assistance: Limited assistance Feeding assistance: Limited assistance Dressing Assistance: Maximum assistance     Functional Limitations Info        Speech Info: Impaired    SPECIAL CARE FACTORS FREQUENCY                       Contractures      Additional Factors Info                  Current Medications (09/11/2015):  This is the current hospital active medication list Current Facility-Administered  Medications  Medication Dose Route Frequency Provider Last Rate Last Dose  . aspirin suppository 300 mg  300 mg Rectal Daily Starleen Arms, MD       Or  . aspirin tablet 325 mg  325 mg Oral Daily Starleen Arms, MD   325 mg at 09/10/15 1151  . enoxaparin (LOVENOX) injection 40 mg  40 mg Subcutaneous Q24H Starleen Arms, MD   40 mg at 09/10/15 1211     Discharge Medications:   Medication List    TAKE these medications       amLODipine 10 MG tablet  Commonly known as: NORVASC  Take 10 mg by mouth daily.     ammonium lactate 12 % lotion  Commonly known as: LAC-HYDRIN  Apply 1 application topically as needed for dry skin.     aspirin 325 MG tablet  Take 1 tablet (325 mg total) by mouth daily.     atenolol 100 MG tablet  Commonly known as: TENORMIN  Take 100 mg by mouth daily.     atorvastatin 80 MG tablet  Commonly known as: LIPITOR  Take 1 tablet (80 mg total) by mouth daily.     benztropine 1 MG tablet  Commonly known as: COGENTIN  Take 1 mg by mouth 2 (two) times daily.  carbamazepine 400 MG 12 hr tablet  Commonly known as: TEGRETOL XR  Take 400 mg by mouth 2 (two) times daily.     furosemide 40 MG tablet  Commonly known as: LASIX  Take 40 mg by mouth daily.     lisinopril 40 MG tablet  Commonly known as: PRINIVIL,ZESTRIL  Take 40 mg by mouth daily.     OLANZapine 5 MG tablet  Commonly known as: ZYPREXA  Take 5 mg by mouth daily.     oxybutynin 5 MG tablet  Commonly known as: DITROPAN  Take 5 mg by mouth 2 (two) times daily.     pantoprazole 40 MG tablet  Commonly known as: PROTONIX  Take 40 mg by mouth daily.     potassium chloride 10 MEQ tablet  Commonly known as: K-DUR  Take 20 mEq by mouth 4 (four) times daily.          Relevant Imaging Results:  Relevant Lab Results:   Additional Information    Annice NeedySettle, Lakara Weiland D, [place]CS[place] [phone]

## 2015-09-11 NOTE — Progress Notes (Signed)
Patient alert and oriented, independent, VSS, pt. Tolerating diet well. No complaints of pain or nausea. Pt. Had IV removed tip intact. Pt. Had prescriptions given. Pt. Voiced understanding of discharge instructions with no further questions. Pt. Discharged via wheelchair with auxilliary.  

## 2015-09-11 NOTE — Care Management Note (Signed)
Case Management Note  Patient Details  Name: Ryan Morton MRN: [id number] Date of Birth: 06/20/1956  Subjective/Objective:                  Pt is from [facility]. Pt is wheelchair bound and requires assist with ADL's.   Action/Plan: Pt discharging today, plans to return to group home. CSW is aware and has been working with pt/family/group home to arrange for return to facility. No CM needs.   Expected Discharge Date:  09/12/15               Expected Discharge Plan:  Assisted Living / Rest Home  In-House Referral:  Clinical Social Work  Discharge planning Services  CM Consult  Post Acute Care Choice:  NA Choice offered to:  NA  DME Arranged:    DME Agency:     HH Arranged:    HH Agency:     Status of Service:  Completed, signed off  Medicare Important Message Given:    Date Medicare IM Given:    Medicare IM give by:    Date Additional Medicare IM Given:    Additional Medicare Important Message give by:     If discussed at Long Length of Stay Meetings, dates discussed:    Additional Comments:  Malcolm MetroChildress, Rogelio Winbush Demske, RN 09/11/2015, 1:55 PM

## 2015-09-11 NOTE — Discharge Summary (Signed)
Physician Discharge Summary  Patient ID: Ryan Morton MRN: [id number] DOB/AGE: 04/13/1956 59 y.o. Primary Care Physician:No primary care provider on file. 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     ammonium lactate 12 % lotion  Commonly known as:  LAC-HYDRIN  Apply 1 application topically as needed for dry skin.     aspirin 325 MG tablet  Take 1 tablet (325 mg total) by mouth daily.     atenolol 100 MG tablet  Commonly known as:  TENORMIN  Take 100 mg by mouth daily.     atorvastatin 80 MG tablet  Commonly known as:  LIPITOR  Take 1 tablet (80 mg total) by mouth daily.     benztropine 1 MG tablet  Commonly known as:  COGENTIN  Take 1 mg by mouth 2 (two) times daily.     carbamazepine 400 MG 12 hr tablet  Commonly known as:  TEGRETOL XR  Take 400 mg by mouth 2 (two) times daily.     furosemide 40 MG tablet  Commonly known as:  LASIX  Take 40 mg by mouth daily.     lisinopril 40 MG tablet  Commonly known as:  PRINIVIL,ZESTRIL  Take 40 mg by mouth daily.     OLANZapine 5 MG tablet  Commonly known as:  ZYPREXA  Take 5 mg by mouth daily.     oxybutynin 5 MG tablet  Commonly known as:  DITROPAN  Take 5 mg by mouth 2 (two) times daily.     pantoprazole 40 MG tablet  Commonly known as:  PROTONIX  Take 40 mg by mouth daily.     potassium chloride 10 MEQ tablet  Commonly known as:  K-DUR  Take 20 mEq by mouth 4 (four) times daily.        Discharged Condition: Improved    Consults: Neurology  Significant Diagnostic Studies: Ct Head Wo Contrast  09/09/2015  CLINICAL DATA:  Left-sided  facial droop beginning at 2:45 p.m. today. Slurred speech EXAM: CT HEAD WITHOUT CONTRAST TECHNIQUE: Contiguous axial images were obtained from the base of the skull through the vertex without intravenous contrast. COMPARISON:  None. FINDINGS: Moderate cerebellar atrophy is present. Left temporal encephalomalacia is noted. The study is mildly degraded by patient motion. Mild subcortical white matter hypoattenuation is present. The basal ganglia and insular ribbon is intact bilaterally. No acute infarct, hemorrhage, or mass lesion is present. The ventricles are of normal size. No significant extra-axial fluid collection is present. The paranasal sinuses and mastoid air cells are clear. The calvarium is intact. IMPRESSION: 1. Moderate cerebellar atrophy. An this is most commonly seen in setting of chronic anti epileptic therapy. Alcohol abuse can give a similar appearance. 2. 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 Electronically Signed   By: Marin Roberts M.D.   On: 09/09/2015 17:04   Mr [name] Head Wo Contrast  09/10/2015  CLINICAL DATA:  Facial droop. Altered mental status and weakness for 2 days. EXAM: MRI HEAD WITHOUT CONTRAST MRA HEAD WITHOUT CONTRAST TECHNIQUE: Multiplanar, multiecho  pulse sequences of the brain and surrounding structures were obtained without intravenous contrast. Angiographic images of the head were obtained using MRA technique without contrast. COMPARISON:  Head CT 09/09/2015 FINDINGS: MRI HEAD FINDINGS There is motion artifact throughout, with some sequences being moderately to severely degraded. Advanced cerebellar atrophy is again noted with only mild cerebral atrophy present. There is no evidence of acute infarct, intracranial hemorrhage, mass, midline shift, or extra-axial fluid collection. Cortical encephalomalacia is noted in the inferolateral left temporal lobe and may reflect the sequelae of prior trauma or remote  ischemia. T2 hyperintensities in the periventricular white matter are nonspecific but compatible with mild chronic small vessel ischemic disease. A small left maxillary sinus mucous retention cyst is noted. No gross acute orbital abnormality is identified. Mastoid air cells are clear. Major intracranial vascular flow voids are preserved. MRA HEAD FINDINGS The study is mildly to moderately motion degraded, limiting branch vessel evaluation. The visualized distal vertebral arteries are patent with the right being strongly dominant. PICA and SCA origins are patent. Basilar artery is patent without stenosis. There are small posterior communicating arteries bilaterally. PCAs are patent without P1 stenosis. There is apparent mild irregular narrowing of both P2 segments, however this may be at least partly artifactual due to motion. The internal carotid arteries are patent from skullbase to carotid termini without stenosis. ACAs and MCAs are patent without evidence of significant proximal stenosis. A2 and M2 evaluation is partially limited by motion artifact. No sizable intracranial aneurysm is identified. IMPRESSION: 1. Motion degraded head MRI without evidence of acute intracranial abnormality. 2. Advanced cerebellar atrophy. 3. Mild chronic small vessel ischemic disease. 4. Left temporal lobe encephalomalacia. 5. Motion degraded head MRA without evidence of major branch occlusion or significant proximal stenosis. Suboptimal branch vessel evaluation. Electronically Signed   By: Sebastian Ache M.D.   On: 09/10/2015 10:49   Mr Brain Wo Contrast  09/10/2015  CLINICAL DATA:  Facial droop. Altered mental status and weakness for 2 days. EXAM: MRI HEAD WITHOUT CONTRAST MRA HEAD WITHOUT CONTRAST TECHNIQUE: Multiplanar, multiecho pulse sequences of the brain and surrounding structures were obtained without intravenous contrast. Angiographic images of the head were obtained using MRA technique without contrast. COMPARISON:  Head  CT 09/09/2015 FINDINGS: MRI HEAD FINDINGS There is motion artifact throughout, with some sequences being moderately to severely degraded. Advanced cerebellar atrophy is again noted with only mild cerebral atrophy present. There is no evidence of acute infarct, intracranial hemorrhage, mass, midline shift, or extra-axial fluid collection. Cortical encephalomalacia is noted in the inferolateral left temporal lobe and may reflect the sequelae of prior trauma or remote ischemia. T2 hyperintensities in the periventricular white matter are nonspecific but compatible with mild chronic small vessel ischemic disease. A small left maxillary sinus mucous retention cyst is noted. No gross acute orbital abnormality is identified. Mastoid air cells are clear. Major intracranial vascular flow voids are preserved. MRA HEAD FINDINGS The study is mildly to moderately motion degraded, limiting branch vessel evaluation. The visualized distal vertebral arteries are patent with the right being strongly dominant. PICA and SCA origins are patent. Basilar artery is patent without stenosis. There are small posterior communicating arteries bilaterally. PCAs are patent without P1 stenosis. There is apparent mild irregular narrowing of both P2 segments, however this may be at least partly artifactual due to motion. The internal carotid arteries are patent from skullbase to carotid termini without stenosis. ACAs and MCAs are patent without evidence of significant proximal stenosis. A2 and M2 evaluation is  partially limited by motion artifact. No sizable intracranial aneurysm is identified. IMPRESSION: 1. Motion degraded head MRI without evidence of acute intracranial abnormality. 2. Advanced cerebellar atrophy. 3. Mild chronic small vessel ischemic disease. 4. Left temporal lobe encephalomalacia. 5. Motion degraded head MRA without evidence of major branch occlusion or significant proximal stenosis. Suboptimal branch vessel evaluation.  Electronically Signed   By: Sebastian Ache M.D.   On: 09/10/2015 10:49   US Carotid Bilateral  09/10/2015  CLINICAL DATA:  TIA. EXAM: BILATERAL CAROTID DUPLEX ULTRASOUND TECHNIQUE: Wallace Cullens scale imaging, color Doppler and duplex ultrasound were performed of bilateral carotid and vertebral arteries in the neck. COMPARISON:  No prior. FINDINGS: Criteria: Quantification of carotid stenosis is based on velocity parameters that correlate the residual internal carotid diameter with NASCET-based stenosis levels, using the diameter of the distal internal carotid lumen as the denominator for stenosis measurement. The following velocity measurements were obtained: RIGHT ICA:  26/11 cm/sec CCA:  67/17 cm/sec SYSTOLIC ICA/CCA RATIO:  0.4 DIASTOLIC ICA/CCA RATIO:  0.7 ECA:  43 cm/sec LEFT ICA:  72/20 cm/sec CCA:  65/15 cm/sec SYSTOLIC ICA/CCA RATIO:  1.1 DIASTOLIC ICA/CCA RATIO:  1.4 ECA:  36 cm/sec RIGHT CAROTID ARTERY: No significant carotid atherosclerotic vascular disease. RIGHT VERTEBRAL ARTERY:  Patent antegrade flow. LEFT CAROTID ARTERY: No significant carotid atherosclerotic vascular disease. LEFT VERTEBRAL ARTERY:  Patent with antegrade flow. IMPRESSION: 1.  No significant carotid atherosclerotic vascular disease. 2.  Vertebral arteries are patent antegrade flow. Electronically Signed   By: [name] Fus  Register   On: 09/10/2015 11:04    Lab Results: Basic Metabolic Panel:  Recent Labs  16/10/96 1633  NA 142  K 3.6  CL 106  CO2 23  GLUCOSE 135*  BUN 11  CREATININE 1.38*  CALCIUM 8.4*   Liver Function Tests:  Recent Labs  09/09/15 1633  AST 28  ALT 23  ALKPHOS 108  BILITOT 0.5  PROT 7.1  ALBUMIN 3.6     CBC:  Recent Labs  09/09/15 1633  WBC 12.3*  NEUTROABS 10.4*  HGB 13.7  HCT 41.1  MCV 94.1  PLT 152    Recent Results (from the past 240 hour(s))  MRSA PCR Screening     Status: None   Collection Time: 09/10/15 11:32 PM  Result Value Ref Range Status   MRSA by PCR NEGATIVE  NEGATIVE Final    Comment:        The GeneXpert MRSA Assay (FDA approved for NASAL specimens only), is one component of a comprehensive MRSA colonization surveillance program. It is not intended to diagnose MRSA infection nor to guide or monitor treatment for MRSA infections.      Hospital Course: This is a 59 year old who has multiple medical problems as listed. He had facial droop and was brought to the emergency department because of that. He was less responsive than usual. There was concern that he might have had a seizure or a stroke. CT and MRI did not show definite stroke but it was significantly influenced by motion artifact. Neurology consultation was obtained. He improved and by about 48 hours in the hospital was back at baseline with no new neurological findings.  Discharge Exam: Blood pressure 129/97, pulse 67, temperature 98.8 F (37.1 C), temperature source Oral, resp. rate 20, height  (1.753 m), weight 97.1 kg (214 lb 1.1 oz), SpO2 98 %. His speech is difficult to understand at baseline. He has ataxia at baseline. His chest is clear.  Disposition: He will return to his  assisted living facility on aspirin and atorvastatin.      Signed: Clemens Lachman L   09/11/2015, 9:26 AM

## 2015-09-11 NOTE — Progress Notes (Signed)
Subjective: He appears to be back to baseline.  Objective: Vital signs in last 24 hours: Temp:  [97.7 F (36.5 C)-98.8 F (37.1 C)] 98.8 F (37.1 C) (12/15 0353) Pulse Rate:  [52-102] 67 (12/15 0353) Resp:  [18-20] 20 (12/15 0353) BP: (127-149)/(53-97) 129/97 mmHg (12/15 0353) SpO2:  [95 %-98 %] 98 % (12/15 0353) Weight change:  Last BM Date: 09/07/15  Intake/Output from previous day: 12/14 0701 - 12/15 0700 In: 240 [P.O.:240] Out: 1750 [Urine:1750]  PHYSICAL EXAM General appearance: alert, cooperative and no distress Resp: clear to auscultation bilaterally Cardio: regular rate and rhythm, S1, S2 normal, no murmur, click, rub or gallop GI: soft, non-tender; bowel sounds normal; no masses,  no organomegaly Extremities: extremities normal, atraumatic, no cyanosis or edema  Lab Results:  Results for orders placed or performed during the hospital encounter of 09/09/15 (from the past 48 hour(s))  Urine rapid drug screen (hosp performed)not at [facility]     Status: None   Collection Time: 09/09/15  4:00 PM  Result Value Ref Range   Opiates NONE DETECTED NONE DETECTED   Cocaine NONE DETECTED NONE DETECTED   Benzodiazepines NONE DETECTED NONE DETECTED   Amphetamines NONE DETECTED NONE DETECTED   Tetrahydrocannabinol NONE DETECTED NONE DETECTED   Barbiturates NONE DETECTED NONE DETECTED    Comment:        DRUG SCREEN FOR MEDICAL PURPOSES ONLY.  IF CONFIRMATION IS NEEDED FOR ANY PURPOSE, NOTIFY LAB WITHIN 5 DAYS.        LOWEST DETECTABLE LIMITS FOR URINE DRUG SCREEN Drug Class       Cutoff (ng/mL) Amphetamine      1000 Barbiturate      200 Benzodiazepine   712 Tricyclics       458 Opiates          300 Cocaine          300 THC              50   Urinalysis, Routine w reflex microscopic (not at [facility])     Status: Abnormal   Collection Time: 09/09/15  4:00 PM  Result Value Ref Range   Color, Urine YELLOW YELLOW   APPearance CLEAR CLEAR   Specific Gravity, Urine <1.005 (L)  1.005 - 1.030   pH 5.5 5.0 - 8.0   Glucose, UA NEGATIVE NEGATIVE mg/dL   Hgb urine dipstick NEGATIVE NEGATIVE   Bilirubin Urine NEGATIVE NEGATIVE   Ketones, ur NEGATIVE NEGATIVE mg/dL   Protein, ur NEGATIVE NEGATIVE mg/dL   Nitrite NEGATIVE NEGATIVE   Leukocytes, UA NEGATIVE NEGATIVE    Comment: MICROSCOPIC NOT DONE ON URINES WITH NEGATIVE PROTEIN, BLOOD, LEUKOCYTES, NITRITE, OR GLUCOSE <1000 mg/dL.  Ethanol     Status: None   Collection Time: 09/09/15  4:33 PM  Result Value Ref Range   Alcohol, Ethyl (B) <5 <5 mg/dL    Comment:        LOWEST DETECTABLE LIMIT FOR SERUM ALCOHOL IS 5 mg/dL FOR MEDICAL PURPOSES ONLY   Protime-INR     Status: None   Collection Time: 09/09/15  4:33 PM  Result Value Ref Range   Prothrombin Time 15.1 11.6 - 15.2 seconds   INR 1.17 0.00 - 1.49  APTT     Status: Abnormal   Collection Time: 09/09/15  4:33 PM  Result Value Ref Range   aPTT 22 (L) 24 - 37 seconds  CBC     Status: Abnormal   Collection Time: 09/09/15  4:33 PM  Result Value  Ref Range   WBC 12.3 (H) 4.0 - 10.5 K/uL   RBC 4.37 4.22 - 5.81 MIL/uL   Hemoglobin 13.7 13.0 - 17.0 g/dL   HCT 41.1 39.0 - 52.0 %   MCV 94.1 78.0 - 100.0 fL   MCH 31.4 26.0 - 34.0 pg   MCHC 33.3 30.0 - 36.0 g/dL   RDW 12.9 11.5 - 15.5 %   Platelets 152 150 - 400 K/uL  Differential     Status: Abnormal   Collection Time: 09/09/15  4:33 PM  Result Value Ref Range   Neutrophils Relative % 85 %   Neutro Abs 10.4 (H) 1.7 - 7.7 K/uL   Lymphocytes Relative 10 %   Lymphs Abs 1.3 0.7 - 4.0 K/uL   Monocytes Relative 4 %   Monocytes Absolute 0.5 0.1 - 1.0 K/uL   Eosinophils Relative 1 %   Eosinophils Absolute 0.1 0.0 - 0.7 K/uL   Basophils Relative 0 %   Basophils Absolute 0.0 0.0 - 0.1 K/uL  Comprehensive metabolic panel     Status: Abnormal   Collection Time: 09/09/15  4:33 PM  Result Value Ref Range   Sodium 142 135 - 145 mmol/L   Potassium 3.6 3.5 - 5.1 mmol/L   Chloride 106 101 - 111 mmol/L   CO2 23 22 - 32  mmol/L   Glucose, Bld 135 (H) 65 - 99 mg/dL   BUN 11 6 - 20 mg/dL   Creatinine, Ser 1.38 (H) 0.61 - 1.24 mg/dL   Calcium 8.4 (L) 8.9 - 10.3 mg/dL   Total Protein 7.1 6.5 - 8.1 g/dL   Albumin 3.6 3.5 - 5.0 g/dL   AST 28 15 - 41 U/L   ALT 23 17 - 63 U/L   Alkaline Phosphatase 108 38 - 126 U/L   Total Bilirubin 0.5 0.3 - 1.2 mg/dL   GFR calc non Af Amer 54 (L) >60 mL/min   GFR calc Af Amer >60 >60 mL/min    Comment: (NOTE) The eGFR has been calculated using the CKD EPI equation. This calculation has not been validated in all clinical situations. eGFR's persistently <60 mL/min signify possible Chronic Kidney Disease.    Anion gap 13 5 - 15  Ammonia     Status: None   Collection Time: 09/09/15  4:33 PM  Result Value Ref Range   Ammonia 31 9 - 35 umol/L  Vitamin B12     Status: None   Collection Time: 09/09/15  4:33 PM  Result Value Ref Range   Vitamin B-12 410 180 - 914 pg/mL    Comment: (NOTE) This assay is not validated for testing neonatal or myeloproliferative syndrome specimens for Vitamin B12 levels. Performed at [facility]   TSH     Status: None   Collection Time: 09/09/15  4:33 PM  Result Value Ref Range   TSH 0.564 0.350 - 4.500 uIU/mL  Hemoglobin A1c     Status: Abnormal   Collection Time: 09/10/15  7:15 AM  Result Value Ref Range   Hgb A1c MFr Bld 6.0 (H) 4.8 - 5.6 %    Comment: (NOTE)         Pre-diabetes: 5.7 - 6.4         Diabetes: >6.4         Glycemic control for adults with diabetes: <7.0    Mean Plasma Glucose 126 mg/dL    Comment: (NOTE) Performed At: [facility] [place], [place] [id number] Lindon Romp MD  [id number]   Lipid panel     Status: Abnormal   Collection Time: 09/10/15  7:15 AM  Result Value Ref Range   Cholesterol 203 (H) 0 - 200 mg/dL   Triglycerides 264 (H) <150 mg/dL   HDL 30 (L) >40 mg/dL   Total CHOL/HDL Ratio 6.8 RATIO   VLDL 53 (H) 0 - 40 mg/dL   LDL Cholesterol 120 (H) 0 - 99 mg/dL     Comment:        Total Cholesterol/HDL:CHD Risk Coronary Heart Disease Risk Table                     Men   Women  1/2 Average Risk   3.4   3.3  Average Risk       5.0   4.4  2 X Average Risk   9.6   7.1  3 X Average Risk  23.4   11.0        Use the calculated Patient Ratio above and the CHD Risk Table to determine the patient's CHD Risk.        ATP III CLASSIFICATION (LDL):  <100     mg/dL   Optimal  100-129  mg/dL   Near or Above                    Optimal  130-159  mg/dL   Borderline  160-189  mg/dL   High  >190     mg/dL   Very High   Carbamazepine level, total     Status: None   Collection Time: 09/10/15  7:22 PM  Result Value Ref Range   Carbamazepine Lvl 4.8 4.0 - 12.0 ug/mL  MRSA PCR Screening     Status: None   Collection Time: 09/10/15 11:32 PM  Result Value Ref Range   MRSA by PCR NEGATIVE NEGATIVE    Comment:        The GeneXpert MRSA Assay (FDA approved for NASAL specimens only), is one component of a comprehensive MRSA colonization surveillance program. It is not intended to diagnose MRSA infection nor to guide or monitor treatment for MRSA infections.     ABGS No results for input(s): PHART, PO2ART, TCO2, HCO3 in the last 72 hours.  Invalid input(s): PCO2 CULTURES Recent Results (from the past 240 hour(s))  MRSA PCR Screening     Status: None   Collection Time: 09/10/15 11:32 PM  Result Value Ref Range Status   MRSA by PCR NEGATIVE NEGATIVE Final    Comment:        The GeneXpert MRSA Assay (FDA approved for NASAL specimens only), is one component of a comprehensive MRSA colonization surveillance program. It is not intended to diagnose MRSA infection nor to guide or monitor treatment for MRSA infections.    Studies/Results: Ct Head Wo Contrast  09/09/2015  CLINICAL DATA:  Left-sided facial droop beginning at 2:45 p.m. today. Slurred speech EXAM: CT HEAD WITHOUT CONTRAST TECHNIQUE: Contiguous axial images were obtained from the base of  the skull through the vertex without intravenous contrast. COMPARISON:  None. FINDINGS: Moderate cerebellar atrophy is present. Left temporal encephalomalacia is noted. The study is mildly degraded by patient motion. Mild subcortical white matter hypoattenuation is present. The basal ganglia and insular ribbon is intact bilaterally. No acute infarct, hemorrhage, or mass lesion is present. The ventricles are of normal size. No significant extra-axial fluid collection is present. The paranasal sinuses and mastoid air cells are clear. The calvarium is intact.  IMPRESSION: 1. Moderate cerebellar atrophy. An this is most commonly seen in setting of chronic anti epileptic therapy. Alcohol abuse can give a similar appearance. 2. 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 Electronically Signed   By: San Morelle M.D.   On: 09/09/2015 17:04   Mr Jodene Nam Head Wo Contrast  09/10/2015  CLINICAL DATA:  Facial droop. Altered mental status and weakness for 2 days. EXAM: MRI HEAD WITHOUT CONTRAST MRA HEAD WITHOUT CONTRAST TECHNIQUE: Multiplanar, multiecho pulse sequences of the brain and surrounding structures were obtained without intravenous contrast. Angiographic images of the head were obtained using MRA technique without contrast. COMPARISON:  Head CT 09/09/2015 FINDINGS: MRI HEAD FINDINGS There is motion artifact throughout, with some sequences being moderately to severely degraded. Advanced cerebellar atrophy is again noted with only mild cerebral atrophy present. There is no evidence of acute infarct, intracranial hemorrhage, mass, midline shift, or extra-axial fluid collection. Cortical encephalomalacia is noted in the inferolateral left temporal lobe and may reflect the sequelae of prior trauma or remote ischemia. T2 hyperintensities in the periventricular white matter are nonspecific but compatible with mild chronic small vessel ischemic disease. A small left  maxillary sinus mucous retention cyst is noted. No gross acute orbital abnormality is identified. Mastoid air cells are clear. Major intracranial vascular flow voids are preserved. MRA HEAD FINDINGS The study is mildly to moderately motion degraded, limiting branch vessel evaluation. The visualized distal vertebral arteries are patent with the right being strongly dominant. PICA and SCA origins are patent. Basilar artery is patent without stenosis. There are small posterior communicating arteries bilaterally. PCAs are patent without P1 stenosis. There is apparent mild irregular narrowing of both P2 segments, however this may be at least partly artifactual due to motion. The internal carotid arteries are patent from skullbase to carotid termini without stenosis. ACAs and MCAs are patent without evidence of significant proximal stenosis. A2 and M2 evaluation is partially limited by motion artifact. No sizable intracranial aneurysm is identified. IMPRESSION: 1. Motion degraded head MRI without evidence of acute intracranial abnormality. 2. Advanced cerebellar atrophy. 3. Mild chronic small vessel ischemic disease. 4. Left temporal lobe encephalomalacia. 5. Motion degraded head MRA without evidence of major branch occlusion or significant proximal stenosis. Suboptimal branch vessel evaluation. Electronically Signed   By: Logan Bores M.D.   On: 09/10/2015 10:49   Mr Brain Wo Contrast  09/10/2015  CLINICAL DATA:  Facial droop. Altered mental status and weakness for 2 days. EXAM: MRI HEAD WITHOUT CONTRAST MRA HEAD WITHOUT CONTRAST TECHNIQUE: Multiplanar, multiecho pulse sequences of the brain and surrounding structures were obtained without intravenous contrast. Angiographic images of the head were obtained using MRA technique without contrast. COMPARISON:  Head CT 09/09/2015 FINDINGS: MRI HEAD FINDINGS There is motion artifact throughout, with some sequences being moderately to severely degraded. Advanced cerebellar  atrophy is again noted with only mild cerebral atrophy present. There is no evidence of acute infarct, intracranial hemorrhage, mass, midline shift, or extra-axial fluid collection. Cortical encephalomalacia is noted in the inferolateral left temporal lobe and may reflect the sequelae of prior trauma or remote ischemia. T2 hyperintensities in the periventricular white matter are nonspecific but compatible with mild chronic small vessel ischemic disease. A small left maxillary sinus mucous retention cyst is noted. No gross acute orbital abnormality is identified. Mastoid air cells are clear. Major intracranial vascular flow voids are preserved. MRA HEAD FINDINGS The study is mildly to moderately motion degraded, limiting branch  vessel evaluation. The visualized distal vertebral arteries are patent with the right being strongly dominant. PICA and SCA origins are patent. Basilar artery is patent without stenosis. There are small posterior communicating arteries bilaterally. PCAs are patent without P1 stenosis. There is apparent mild irregular narrowing of both P2 segments, however this may be at least partly artifactual due to motion. The internal carotid arteries are patent from skullbase to carotid termini without stenosis. ACAs and MCAs are patent without evidence of significant proximal stenosis. A2 and M2 evaluation is partially limited by motion artifact. No sizable intracranial aneurysm is identified. IMPRESSION: 1. Motion degraded head MRI without evidence of acute intracranial abnormality. 2. Advanced cerebellar atrophy. 3. Mild chronic small vessel ischemic disease. 4. Left temporal lobe encephalomalacia. 5. Motion degraded head MRA without evidence of major branch occlusion or significant proximal stenosis. Suboptimal branch vessel evaluation. Electronically Signed   By: Logan Bores M.D.   On: 09/10/2015 10:49   US Carotid Bilateral  09/10/2015  CLINICAL DATA:  TIA. EXAM: BILATERAL CAROTID DUPLEX  ULTRASOUND TECHNIQUE: Pearline Cables scale imaging, color Doppler and duplex ultrasound were performed of bilateral carotid and vertebral arteries in the neck. COMPARISON:  No prior. FINDINGS: Criteria: Quantification of carotid stenosis is based on velocity parameters that correlate the residual internal carotid diameter with NASCET-based stenosis levels, using the diameter of the distal internal carotid lumen as the denominator for stenosis measurement. The following velocity measurements were obtained: RIGHT ICA:  26/11 cm/sec CCA:  94/49 cm/sec SYSTOLIC ICA/CCA RATIO:  0.4 DIASTOLIC ICA/CCA RATIO:  0.7 ECA:  43 cm/sec LEFT ICA:  72/20 cm/sec CCA:  67/59 cm/sec SYSTOLIC ICA/CCA RATIO:  1.1 DIASTOLIC ICA/CCA RATIO:  1.4 ECA:  36 cm/sec RIGHT CAROTID ARTERY: No significant carotid atherosclerotic vascular disease. RIGHT VERTEBRAL ARTERY:  Patent antegrade flow. LEFT CAROTID ARTERY: No significant carotid atherosclerotic vascular disease. LEFT VERTEBRAL ARTERY:  Patent with antegrade flow. IMPRESSION: 1.  No significant carotid atherosclerotic vascular disease. 2.  Vertebral arteries are patent antegrade flow. Electronically Signed   By: [place]   On: 09/10/2015 11:04    Medications:  Prior to Admission:  Prescriptions prior to admission  Medication Sig Dispense Refill Last Dose  . amLODipine (NORVASC) 10 MG tablet Take 10 mg by mouth daily.   09/09/2015 at Unknown time  . ammonium lactate (LAC-HYDRIN) 12 % lotion Apply 1 application topically as needed for dry skin.   09/09/2015 at Unknown time  . atenolol (TENORMIN) 100 MG tablet Take 100 mg by mouth daily.   09/09/2015 at 800A  . benztropine (COGENTIN) 1 MG tablet Take 1 mg by mouth 2 (two) times daily.   09/09/2015 at Unknown time  . carbamazepine (TEGRETOL XR) 400 MG 12 hr tablet Take 400 mg by mouth 2 (two) times daily.   09/09/2015 at 800A  . furosemide (LASIX) 40 MG tablet Take 40 mg by mouth daily.   09/09/2015 at Unknown time  . lisinopril  (PRINIVIL,ZESTRIL) 40 MG tablet Take 40 mg by mouth daily.   09/09/2015 at Unknown time  . OLANZapine (ZYPREXA) 5 MG tablet Take 5 mg by mouth daily.   09/09/2015 at Unknown time  . oxybutynin (DITROPAN) 5 MG tablet Take 5 mg by mouth 2 (two) times daily.   09/09/2015 at Unknown time  . pantoprazole (PROTONIX) 40 MG tablet Take 40 mg by mouth daily.   09/09/2015 at Unknown time  . potassium chloride (K-DUR) 10 MEQ tablet Take 20 mEq by mouth 4 (four) times daily.  09/09/2015 at Unknown time   Scheduled: . aspirin  300 mg Rectal Daily   Or  . aspirin  325 mg Oral Daily  . enoxaparin (LOVENOX) injection  40 mg Subcutaneous Q24H   Continuous:  PRN:  Assesment: It appears that he may have had a TIA. This also could have been a seizure. There is nothing particularly revealing on his laboratory work and he is back at baseline now. Active Problems:   Facial droop   Hypertension   Urinary incontinence   Convulsion ([place])    Plan:probable discharge      Jacole Capley L 09/11/2015, 9:03 AM

## 2015-09-11 NOTE — Clinical Social Work Note (Addendum)
CSW spoke with Ryan Morton of [facility] and advised that patient would be discharging today.  CSW advised Ryan Morton that patient's PCP desired for facility staff to come and assess patient to ensure that he was at baseline prior to putting in the discharge order.  CSW left a voicemail for patient's sister, Ryan Morton, advising that patient was being discharged today.   [facility]  CSW signing off.      Annice NeedySettle, Mandeep Ferch D, [place] [phone]

## 2015-09-11 NOTE — Evaluation (Signed)
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 At baseline, pt presents with BLE weakness, incontinence, and WC for mobility. Pt comes to [place] from ALF where he has lived since 2008, after a sudden onset facial droop and AMS.    Clinical Impression   Pt awake, alert, agreeable to evaluation this am. Pt able to follow commands, very pleasant. Pt required min assistance with breakfast for set-up and some feeding due to ataxia at baseline. Pt reports sometimes he has assistance with feeding and sometimes not. Pt demonstrates greater strength in LUE, which is nondominant, pt reports this is normal for him. Pt able to use RUE as dominant during evaluation. Pt appears to be at functional baseline with ADL completion, reports he is feeling much better. No further OT services required, pt is safe to discharge back to ALF.      Follow Up Recommendations   (Return to ALF)    Equipment Recommendations  None recommended by OT       Precautions / Restrictions Precautions Precautions: Fall Restrictions Weight Bearing Restrictions: No              ADL Overall ADL's : Needs assistance/impaired Eating/Feeding: Minimal assistance;Bed level                                                     Pertinent Vitals/Pain Pain Assessment: No/denies pain     Hand Dominance Right   Extremity/Trunk Assessment Upper Extremity Assessment Upper Extremity Assessment: Overall WFL for tasks assessed (At baseline: very strong, L>R, moderate dysmetria, light tou)   Lower Extremity Assessment Lower Extremity Assessment: Defer to PT evaluation       Communication Communication Communication: Expressive difficulties (dysarthia )   Cognition  Arousal/Alertness: Awake/alert Behavior During Therapy: WFL for tasks assessed/performed Overall Cognitive Status: History of cognitive impairments - at baseline                                Home Living Family/patient expects to be discharged to:: Assisted living                             Home Equipment: Wheelchair - manual          Prior Functioning/Environment Level of Independence: Needs assistance  Gait / Transfers Assistance Needed: Able to transfer from bed to [facility] with minimal assistance.  ADL's / Homemaking Assistance Needed: Requires assistance with ADL tasks due to baseline ataxic movements         End of Session    Activity Tolerance: Patient tolerated treatment well Patient left: in bed;with call bell/phone within reach;with bed alarm set   Time: 7829-5621 OT Time Calculation (min): 19 min Charges:  OT General Charges $OT Visit: 1 Procedure OT Evaluation $Initial OT Evaluation Tier I: 1 Procedure G-Codes: OT G-codes **NOT FOR INPATIENT CLASS** Functional Assessment Tool Used: clinical judgement Functional Limitation: Self care Self Care Current Status (H0865): At least 80 percent but less than 100 percent impaired, limited or restricted Self Care Goal Status (H8469):  At least 80 percent but less than 100 percent impaired, limited or restricted Self Care Discharge Status [phone](G8989): At least 80 percent but less than 100 percent impaired, limited or restricted  Ezra SitesLeslie Troxler, OTR/L  [phone]  09/11/2015, 8:42 AM

## 2015-09-12 LAB — HIV ANTIBODY (ROUTINE TESTING W REFLEX): HIV Screen 4th Generation wRfx: NONREACTIVE

## 2015-09-12 LAB — RPR: RPR Ser Ql: NONREACTIVE

## 2015-09-17 LAB — HOMOCYSTEINE: HOMOCYSTEINE-NORM: 13.6 umol/L (ref 0.0–15.0)

## 2015-11-18 ENCOUNTER — Ambulatory Visit (HOSPITAL_COMMUNITY)
Admission: RE | Admit: 2015-11-18 | Discharge: 2015-11-18 | Disposition: A | Discharge status: Home / Self Care | Payer: Medicaid Other | Source: Ambulatory Visit | Attending: Pulmonary Disease | Admitting: Pulmonary Disease

## 2015-11-18 ENCOUNTER — Other Ambulatory Visit (HOSPITAL_COMMUNITY): Payer: Self-pay | Admitting: Pulmonary Disease

## 2015-11-18 DIAGNOSIS — M79602 Pain in left arm: Secondary | ICD-10-CM | POA: Insufficient documentation

## 2015-11-18 DIAGNOSIS — M25512 Pain in left shoulder: Secondary | ICD-10-CM | POA: Insufficient documentation

## 2015-11-18 DIAGNOSIS — Z9181 History of falling: Secondary | ICD-10-CM | POA: Insufficient documentation

## 2015-11-18 DIAGNOSIS — M79642 Pain in left hand: Secondary | ICD-10-CM | POA: Insufficient documentation

## 2015-11-18 DIAGNOSIS — M542 Cervicalgia: Secondary | ICD-10-CM | POA: Insufficient documentation

## 2015-12-19 ENCOUNTER — Emergency Department (HOSPITAL_COMMUNITY): Payer: Medicaid Other

## 2015-12-19 ENCOUNTER — Encounter (HOSPITAL_COMMUNITY): Payer: Self-pay | Admitting: Family Medicine

## 2015-12-19 ENCOUNTER — Inpatient Hospital Stay (HOSPITAL_COMMUNITY)
Admission: EM | Admit: 2015-12-19 | Discharge: 2015-12-25 | DRG: 603 | Disposition: A | Discharge status: Skilled Nursing Facility | Payer: Medicaid Other | Attending: Pulmonary Disease | Admitting: Pulmonary Disease

## 2015-12-19 DIAGNOSIS — M7032 Other bursitis of elbow, left elbow: Secondary | ICD-10-CM | POA: Diagnosis present

## 2015-12-19 DIAGNOSIS — Z87891 Personal history of nicotine dependence: Secondary | ICD-10-CM | POA: Diagnosis not present

## 2015-12-19 DIAGNOSIS — R531 Weakness: Secondary | ICD-10-CM | POA: Diagnosis present

## 2015-12-19 DIAGNOSIS — F71 Moderate intellectual disabilities: Secondary | ICD-10-CM | POA: Diagnosis present

## 2015-12-19 DIAGNOSIS — R5381 Other malaise: Secondary | ICD-10-CM

## 2015-12-19 DIAGNOSIS — I10 Essential (primary) hypertension: Secondary | ICD-10-CM

## 2015-12-19 DIAGNOSIS — M7989 Other specified soft tissue disorders: Secondary | ICD-10-CM | POA: Diagnosis present

## 2015-12-19 DIAGNOSIS — I129 Hypertensive chronic kidney disease with stage 1 through stage 4 chronic kidney disease, or unspecified chronic kidney disease: Secondary | ICD-10-CM | POA: Diagnosis present

## 2015-12-19 DIAGNOSIS — N183 Chronic kidney disease, stage 3 unspecified: Secondary | ICD-10-CM | POA: Diagnosis present

## 2015-12-19 DIAGNOSIS — E876 Hypokalemia: Secondary | ICD-10-CM

## 2015-12-19 DIAGNOSIS — L0291 Cutaneous abscess, unspecified: Secondary | ICD-10-CM

## 2015-12-19 DIAGNOSIS — R5383 Other fatigue: Secondary | ICD-10-CM

## 2015-12-19 DIAGNOSIS — F259 Schizoaffective disorder, unspecified: Secondary | ICD-10-CM | POA: Diagnosis present

## 2015-12-19 DIAGNOSIS — L03114 Cellulitis of left upper limb: Principal | ICD-10-CM | POA: Diagnosis present

## 2015-12-19 DIAGNOSIS — D72829 Elevated white blood cell count, unspecified: Secondary | ICD-10-CM | POA: Diagnosis not present

## 2015-12-19 DIAGNOSIS — R569 Unspecified convulsions: Secondary | ICD-10-CM

## 2015-12-19 LAB — COMPREHENSIVE METABOLIC PANEL
ALBUMIN: 4.1 g/dL (ref 3.5–5.0)
ALK PHOS: 112 U/L (ref 38–126)
ALT: 23 U/L (ref 17–63)
AST: 41 U/L (ref 15–41)
Anion gap: 9 (ref 5–15)
BUN: 22 mg/dL — ABNORMAL HIGH (ref 6–20)
CALCIUM: 8.6 mg/dL — AB (ref 8.9–10.3)
CO2: 27 mmol/L (ref 22–32)
CREATININE: 1.73 mg/dL — AB (ref 0.61–1.24)
Chloride: 105 mmol/L (ref 101–111)
GFR calc non Af Amer: 41 mL/min — ABNORMAL LOW (ref >60)
GFR, EST AFRICAN AMERICAN: 48 mL/min — AB (ref >60)
GLUCOSE: 111 mg/dL — AB (ref 65–99)
Potassium: 3.4 mmol/L — ABNORMAL LOW (ref 3.5–5.1)
SODIUM: 141 mmol/L (ref 135–145)
Total Bilirubin: 1.1 mg/dL (ref 0.3–1.2)
Total Protein: 8.7 g/dL — ABNORMAL HIGH (ref 6.5–8.1)

## 2015-12-19 LAB — URINE MICROSCOPIC-ADD ON

## 2015-12-19 LAB — CBC WITH DIFFERENTIAL/PLATELET
Basophils Absolute: 0 10*3/uL (ref 0.0–0.1)
Basophils Relative: 0 %
Eosinophils Absolute: 0 10*3/uL (ref 0.0–0.7)
Eosinophils Relative: 0 %
HCT: 41.7 % (ref 39.0–52.0)
HEMOGLOBIN: 13.8 g/dL (ref 13.0–17.0)
LYMPHS PCT: 7 %
Lymphs Abs: 2.1 10*3/uL (ref 0.7–4.0)
MCH: 31 pg (ref 26.0–34.0)
MCHC: 33.1 g/dL (ref 30.0–36.0)
MCV: 93.7 fL (ref 78.0–100.0)
Monocytes Absolute: 1.9 10*3/uL — ABNORMAL HIGH (ref 0.1–1.0)
Monocytes Relative: 6 %
NEUTROS ABS: 26 10*3/uL — AB (ref 1.7–7.7)
Neutrophils Relative %: 86 %
Platelets: 222 10*3/uL (ref 150–400)
RBC: 4.45 MIL/uL (ref 4.22–5.81)
RDW: 13.5 % (ref 11.5–15.5)
WBC: 30.1 10*3/uL — AB (ref 4.0–10.5)

## 2015-12-19 LAB — URINALYSIS, ROUTINE W REFLEX MICROSCOPIC
Bilirubin Urine: NEGATIVE
GLUCOSE, UA: NEGATIVE mg/dL
Ketones, ur: NEGATIVE mg/dL
Nitrite: NEGATIVE
Protein, ur: NEGATIVE mg/dL
SPECIFIC GRAVITY, URINE: 1.005 (ref 1.005–1.030)
pH: 6.5 (ref 5.0–8.0)

## 2015-12-19 LAB — I-STAT CG4 LACTIC ACID, ED: LACTIC ACID, VENOUS: 1.32 mmol/L (ref 0.5–2.0)

## 2015-12-19 LAB — LACTIC ACID, PLASMA
Lactic Acid, Venous: 1.4 mmol/L (ref 0.5–2.0)
Lactic Acid, Venous: 1.6 mmol/L (ref 0.5–2.0)

## 2015-12-19 LAB — MRSA PCR SCREENING: MRSA BY PCR: POSITIVE — AB

## 2015-12-19 LAB — PROTIME-INR
INR: 1.35 (ref 0.00–1.49)
Prothrombin Time: 16.8 seconds — ABNORMAL HIGH (ref 11.6–15.2)

## 2015-12-19 LAB — URIC ACID: URIC ACID, SERUM: 8.2 mg/dL — AB (ref 4.4–7.6)

## 2015-12-19 LAB — MAGNESIUM: MAGNESIUM: 2.1 mg/dL (ref 1.7–2.4)

## 2015-12-19 LAB — SEDIMENTATION RATE: Sed Rate: 40 mm/hr — ABNORMAL HIGH (ref 0–16)

## 2015-12-19 LAB — APTT: APTT: 27 s (ref 24–37)

## 2015-12-19 LAB — CARBAMAZEPINE LEVEL, TOTAL: Carbamazepine Lvl: 9.5 ug/mL (ref 4.0–12.0)

## 2015-12-19 LAB — PROCALCITONIN: Procalcitonin: 4.4 ng/mL

## 2015-12-19 LAB — TSH: TSH: 0.408 u[IU]/mL (ref 0.350–4.500)

## 2015-12-19 LAB — C-REACTIVE PROTEIN: CRP: 21 mg/dL — ABNORMAL HIGH (ref <1.0)

## 2015-12-19 LAB — I-STAT TROPONIN, ED: Troponin i, poc: 0 ng/mL (ref 0.00–0.08)

## 2015-12-19 MED ORDER — HYDROCODONE-ACETAMINOPHEN 5-325 MG PO TABS
1.0000 | ORAL_TABLET | ORAL | Status: DC | PRN
  Administered 2015-12-23 – 2015-12-24 (×3): 2 via ORAL
  Filled 2015-12-19 (×3): qty 2

## 2015-12-19 MED ORDER — HEPARIN SODIUM (PORCINE) 5000 UNIT/ML IJ SOLN
5000.0000 [IU] | Freq: Three times a day (TID) | INTRAMUSCULAR | Status: DC
  Administered 2015-12-19 – 2015-12-25 (×18): 5000 [IU] via SUBCUTANEOUS
  Filled 2015-12-19 (×17): qty 1

## 2015-12-19 MED ORDER — BENZTROPINE MESYLATE 1 MG PO TABS
1.0000 mg | ORAL_TABLET | Freq: Two times a day (BID) | ORAL | Status: DC
  Administered 2015-12-19 – 2015-12-25 (×12): 1 mg via ORAL
  Filled 2015-12-19 (×12): qty 1

## 2015-12-19 MED ORDER — PANTOPRAZOLE SODIUM 40 MG PO TBEC: 40.0000 mg | DELAYED_RELEASE_TABLET | Freq: Every day | ORAL | Status: DC

## 2015-12-19 MED ORDER — OLANZAPINE 5 MG PO TABS: 5.0000 mg | ORAL_TABLET | Freq: Every day | ORAL | Status: DC

## 2015-12-19 MED ORDER — OLANZAPINE 5 MG PO TABS
5.0000 mg | ORAL_TABLET | Freq: Every day | ORAL | Status: DC
  Administered 2015-12-20 – 2015-12-25 (×6): 5 mg via ORAL
  Filled 2015-12-19 (×6): qty 1

## 2015-12-19 MED ORDER — OXYBUTYNIN CHLORIDE 5 MG PO TABS
5.0000 mg | ORAL_TABLET | Freq: Two times a day (BID) | ORAL | Status: DC
  Administered 2015-12-19 – 2015-12-25 (×12): 5 mg via ORAL
  Filled 2015-12-19 (×12): qty 1

## 2015-12-19 MED ORDER — AMLODIPINE BESYLATE 5 MG PO TABS: 10.0000 mg | ORAL_TABLET | Freq: Every day | ORAL | Status: DC

## 2015-12-19 MED ORDER — AMMONIUM LACTATE 12 % EX LOTN
1.0000 "application " | TOPICAL_LOTION | CUTANEOUS | Status: DC | PRN
  Filled 2015-12-19: qty 400

## 2015-12-19 MED ORDER — ATENOLOL 25 MG PO TABS
100.0000 mg | ORAL_TABLET | Freq: Every day | ORAL | Status: DC
  Administered 2015-12-20 – 2015-12-25 (×6): 100 mg via ORAL
  Filled 2015-12-19 (×6): qty 4

## 2015-12-19 MED ORDER — MUPIROCIN 2 % EX OINT
1.0000 "application " | TOPICAL_OINTMENT | Freq: Two times a day (BID) | CUTANEOUS | Status: AC
  Administered 2015-12-20 – 2015-12-24 (×10): 1 via NASAL
  Filled 2015-12-19: qty 22

## 2015-12-19 MED ORDER — POTASSIUM CHLORIDE CRYS ER 20 MEQ PO TBCR
20.0000 meq | EXTENDED_RELEASE_TABLET | Freq: Four times a day (QID) | ORAL | Status: DC
  Administered 2015-12-19 – 2015-12-25 (×23): 20 meq via ORAL
  Filled 2015-12-19 (×3): qty 1
  Filled 2015-12-19: qty 2
  Filled 2015-12-19 (×3): qty 1
  Filled 2015-12-19: qty 2
  Filled 2015-12-19 (×2): qty 1
  Filled 2015-12-19 (×2): qty 2
  Filled 2015-12-19: qty 1
  Filled 2015-12-19 (×3): qty 2
  Filled 2015-12-19: qty 1
  Filled 2015-12-19: qty 2
  Filled 2015-12-19 (×3): qty 1
  Filled 2015-12-19 (×2): qty 2
  Filled 2015-12-19: qty 1
  Filled 2015-12-19: qty 2
  Filled 2015-12-19: qty 1
  Filled 2015-12-19: qty 2
  Filled 2015-12-19 (×3): qty 1
  Filled 2015-12-19: qty 2
  Filled 2015-12-19 (×4): qty 1
  Filled 2015-12-19 (×2): qty 2

## 2015-12-19 MED ORDER — DEXTROSE 5 % IV SOLN
2.0000 g | INTRAVENOUS | Status: DC
  Administered 2015-12-20 – 2015-12-24 (×5): 2 g via INTRAVENOUS
  Filled 2015-12-19 (×7): qty 2

## 2015-12-19 MED ORDER — CHLORHEXIDINE GLUCONATE CLOTH 2 % EX PADS
6.0000 | MEDICATED_PAD | Freq: Every day | CUTANEOUS | Status: AC
  Administered 2015-12-20 – 2015-12-24 (×4): 6 via TOPICAL

## 2015-12-19 MED ORDER — ACETAMINOPHEN 325 MG PO TABS
650.0000 mg | ORAL_TABLET | Freq: Four times a day (QID) | ORAL | Status: DC | PRN
  Administered 2015-12-20 – 2015-12-21 (×2): 650 mg via ORAL
  Filled 2015-12-19 (×2): qty 2

## 2015-12-19 MED ORDER — POLYETHYLENE GLYCOL 3350 17 G PO PACK
17.0000 g | PACK | Freq: Every day | ORAL | Status: DC | PRN
  Administered 2015-12-23: 17 g via ORAL
  Filled 2015-12-19: qty 1

## 2015-12-19 MED ORDER — ONDANSETRON HCL 4 MG/2ML IJ SOLN: 4.0000 mg | Freq: Four times a day (QID) | INTRAMUSCULAR | Status: DC | PRN

## 2015-12-19 MED ORDER — AMLODIPINE BESYLATE 5 MG PO TABS
10.0000 mg | ORAL_TABLET | Freq: Every day | ORAL | Status: DC
  Administered 2015-12-20 – 2015-12-25 (×6): 10 mg via ORAL
  Filled 2015-12-19 (×6): qty 2

## 2015-12-19 MED ORDER — VANCOMYCIN HCL IN DEXTROSE 1-5 GM/200ML-% IV SOLN: 1000.0000 mg | Freq: Once | INTRAVENOUS | Status: DC

## 2015-12-19 MED ORDER — ACETAMINOPHEN 650 MG RE SUPP: 650.0000 mg | Freq: Four times a day (QID) | RECTAL | Status: DC | PRN

## 2015-12-19 MED ORDER — PANTOPRAZOLE SODIUM 40 MG PO TBEC
40.0000 mg | DELAYED_RELEASE_TABLET | Freq: Every day | ORAL | Status: DC
  Administered 2015-12-20 – 2015-12-25 (×6): 40 mg via ORAL
  Filled 2015-12-19 (×6): qty 1

## 2015-12-19 MED ORDER — ATENOLOL 25 MG PO TABS: 100.0000 mg | ORAL_TABLET | Freq: Every day | ORAL | Status: DC

## 2015-12-19 MED ORDER — BISACODYL 5 MG PO TBEC: 5.0000 mg | DELAYED_RELEASE_TABLET | Freq: Every day | ORAL | Status: DC | PRN

## 2015-12-19 MED ORDER — CARBAMAZEPINE ER 400 MG PO TB12
400.0000 mg | ORAL_TABLET | Freq: Two times a day (BID) | ORAL | Status: DC
  Administered 2015-12-19 – 2015-12-25 (×12): 400 mg via ORAL
  Filled 2015-12-19 (×2): qty 2
  Filled 2015-12-19 (×2): qty 1
  Filled 2015-12-19 (×3): qty 2
  Filled 2015-12-19: qty 1
  Filled 2015-12-19: qty 2
  Filled 2015-12-19 (×5): qty 1

## 2015-12-19 MED ORDER — ONDANSETRON HCL 4 MG PO TABS: 4.0000 mg | ORAL_TABLET | Freq: Four times a day (QID) | ORAL | Status: DC | PRN

## 2015-12-19 NOTE — ED Provider Notes (Signed)
CSN: [id number]     Arrival date & time 12/19/15  1330 History   First MD Initiated Contact with Patient 12/19/15 1340     Chief Complaint  Patient presents with  . Weakness     (Consider location/radiation/quality/duration/timing/severity/associated sxs/prior Treatment) Patient is a 60 y.o. male presenting with weakness. The history is provided by the nursing home (The patient was brought over from the assisted living because he had weakness. Patient is usually nonambulatory).  Weakness This is a new problem. The current episode started 12 to 24 hours ago. The problem occurs constantly. The problem has not changed since onset.Pertinent negatives include no chest pain, no abdominal pain and no headaches. Nothing aggravates the symptoms. Nothing relieves the symptoms.    Past Medical History  Diagnosis Date  . Hypertension   . Convulsions (HCC)   . Psychosis   . Urinary incontinence    No past surgical history on file. No family history on file. Social History  Substance Use Topics  . Smoking status: Former Games developermoker  . Smokeless tobacco: Not on file  . Alcohol Use: No     Comment: former    Review of Systems  Constitutional: Negative for appetite change and fatigue.  HENT: Negative for congestion, ear discharge and sinus pressure.   Eyes: Negative for discharge.  Respiratory: Negative for cough.   Cardiovascular: Negative for chest pain.  Gastrointestinal: Negative for abdominal pain and diarrhea.  Genitourinary: Negative for frequency and hematuria.  Musculoskeletal: Negative for back pain.  Skin: Negative for rash.  Neurological: Positive for weakness. Negative for seizures and headaches.  Psychiatric/Behavioral: Negative for hallucinations.      Allergies  Review of patient's allergies indicates no known allergies.  Home Medications   Prior to Admission medications   Medication Sig Start Date End Date Taking? Authorizing Provider  amLODipine (NORVASC) 10 MG  tablet Take 10 mg by mouth daily.   Yes Historical Provider, MD  ammonium lactate (LAC-HYDRIN) 12 % lotion Apply 1 application topically as needed for dry skin.   Yes Historical Provider, MD  atenolol (TENORMIN) 100 MG tablet Take 100 mg by mouth daily.   Yes Historical Provider, MD  benztropine (COGENTIN) 1 MG tablet Take 1 mg by mouth 2 (two) times daily.   Yes Historical Provider, MD  carbamazepine (TEGRETOL XR) 400 MG 12 hr tablet Take 400 mg by mouth 2 (two) times daily.   Yes Historical Provider, MD  furosemide (LASIX) 40 MG tablet Take 40 mg by mouth daily.   Yes Historical Provider, MD  lisinopril (PRINIVIL,ZESTRIL) 40 MG tablet Take 40 mg by mouth daily.   Yes Historical Provider, MD  OLANZapine (ZYPREXA) 5 MG tablet Take 5 mg by mouth daily.   Yes Historical Provider, MD  oxybutynin (DITROPAN) 5 MG tablet Take 5 mg by mouth 2 (two) times daily.   Yes Historical Provider, MD  pantoprazole (PROTONIX) 40 MG tablet Take 40 mg by mouth daily.   Yes Historical Provider, MD  potassium chloride (K-DUR) 10 MEQ tablet Take 20 mEq by mouth 4 (four) times daily.   Yes Historical Provider, MD  aspirin 325 MG tablet Take 1 tablet (325 mg total) by mouth daily. Patient not taking: Reported on 12/19/2015 09/11/15   Kari BaarsEdward Hawkins, MD  atorvastatin (LIPITOR) 80 MG tablet Take 1 tablet (80 mg total) by mouth daily. Patient not taking: Reported on 12/19/2015 09/11/15   Kari BaarsEdward Hawkins, MD   BP 122/78 mmHg  Pulse 94  Temp(Src) 99.3 F (37.4 C) (Oral)  Resp 17  Ht  (1.803 m)  Wt 195 lb (88.451 kg)  BMI 27.21 kg/m2  SpO2 100% Physical Exam  Constitutional: He is oriented to person, place, and time. He appears well-developed.  HENT:  Head: Normocephalic.  Eyes: Conjunctivae and EOM are normal. No scleral icterus.  Neck: Neck supple. No thyromegaly present.  Cardiovascular: Normal rate and regular rhythm.  Exam reveals no gallop and no friction rub.   No murmur heard. Pulmonary/Chest: No  stridor. He has no wheezes. He has no rales. He exhibits no tenderness.  Abdominal: He exhibits no distension. There is no tenderness. There is no rebound.  Musculoskeletal: Normal range of motion. He exhibits no edema.  Patient has severe weakness in lower extremities supposedly he does not walk. Patient has swelling to his proximal left forearm with tenderness neurovascular exam normal  Lymphadenopathy:    He has no cervical adenopathy.  Neurological: He is oriented to person, place, and time. He exhibits normal muscle tone. Coordination normal.  Skin: No rash noted. No erythema.  Psychiatric: He has a normal mood and affect. His behavior is normal.    ED Course  Procedures (including critical care time) Labs Review Labs Reviewed  CBC WITH DIFFERENTIAL/PLATELET - Abnormal; Notable for the following:    WBC 30.1 (*)    Neutro Abs 26.0 (*)    Monocytes Absolute 1.9 (*)    All other components within normal limits  COMPREHENSIVE METABOLIC PANEL - Abnormal; Notable for the following:    Potassium 3.4 (*)    Glucose, Bld 111 (*)    BUN 22 (*)    Creatinine, Ser 1.73 (*)    Calcium 8.6 (*)    Total Protein 8.7 (*)    GFR calc non Af Amer 41 (*)    GFR calc Af Amer 48 (*)    All other components within normal limits  URINALYSIS, ROUTINE W REFLEX MICROSCOPIC (NOT AT [facility]) - Abnormal; Notable for the following:    Hgb urine dipstick SMALL (*)    Leukocytes, UA SMALL (*)    All other components within normal limits  URINE MICROSCOPIC-ADD ON - Abnormal; Notable for the following:    Squamous Epithelial / LPF 0-5 (*)    Bacteria, UA RARE (*)    All other components within normal limits  CULTURE, BLOOD (ROUTINE X 2)  CULTURE, BLOOD (ROUTINE X 2)  I-STAT TROPOININ, ED  I-STAT CG4 LACTIC ACID, ED    Imaging Review Mr Brain Wo Contrast  12/19/2015  CLINICAL DATA:  Weakness EXAM: MRI HEAD WITHOUT CONTRAST TECHNIQUE: Multiplanar, multiecho pulse sequences of the brain and surrounding  structures were obtained without intravenous contrast. COMPARISON:  MRI 09/10/2015 FINDINGS: Negative for acute infarct. Marked cerebellar atrophy similar to the prior study. Cerebral hemispheres show normal volume. Periventricular white matter hyperintensity similar to the prior study and likely related to chronic microvascular ischemia. 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 Negative for intracranial hemorrhage. Negative for mass or edema. No shift of the midline structures. Mucosal edema paranasal sinuses. Normal pituitary. Normal skullbase. IMPRESSION: Negative for acute infarct. Marked cerebellar atrophy unchanged from the prior study. This may be related to pharmaceutical side effects. Alcohol and congenital causes also are possible. Chronic ischemic changes as above are stable Mucosal edema paranasal sinuses. Electronically Signed   By: Marlan Palau M.D.   On: 12/19/2015 15:36   Dg Chest Portable 1 View  12/19/2015  CLINICAL DATA:  Weakness EXAM: PORTABLE CHEST 1 VIEW COMPARISON:  None.  FINDINGS: The heart size and mediastinal contours are within normal limits. Both lungs are clear. The visualized skeletal structures are unremarkable. IMPRESSION: No active disease. Electronically Signed   By: Alcide Clever M.D.   On: 12/19/2015 14:37   I have personally reviewed and evaluated these images and lab results as part of my medical decision-making.   EKG Interpretation None      MDM   Final diagnoses:  Leukocytosis    Patient with leukocytosis suspect possible infection to left forearm patient will be admitted to medicine    Bethann Berkshire, MD 12/19/15 1624

## 2015-12-19 NOTE — ED Notes (Signed)
PT bib EMS from [facility] [place]. Pt has limited verbal ability. EMS reports assisted living staff reports pt was weaker than normal. This RN called the facility, staff reports they do not know pt's underlying issues, do not know why pt can't walk at baseline, and states " I dont know anything about him, I am new. I will call the administrators and have them call you."

## 2015-12-19 NOTE — H&P (Signed)
[facility] History and Physical  CHIN WACHTER ZOX:[id number] DOB: 19-Jun-1956 DOA: 12/19/2015  Referring physician: ED physician PCP:  Kari Baars, MD Specialists: None listed   Chief Complaint: Generalized weakness  HPI: BRODI KARI is a 60 y.o. male with PMH of seizure disorder, unspecified psychosis, and hypertension who presents from his assisted living facility with generalized weakness. At baseline, the patient is apparently nonambulatory for unknown reasons, but was noted by ALF personnel to be generally weak for the past day, exhibiting difficulty feeding himself or sitting up in bed. There has been no fevers, cough, vomiting, or diarrhea noted. There is been no witnessed fall, apparently trauma, or recent seizure-like activity. Patient has complained of left elbow and forearm pain, as well as pain in the right thumb, but has otherwise been without complaints. He was transported to the ED for evaluation of his acute onset weakness.  In ED, patient was found to be afebrile, saturating well on room air, and with vital signs stable. MRI of the brain was performed and there are no acute findings noted. Chest x-ray is negative for acute cardiopulmonary disease. CMP is notable for a mild hypokalemia and a serum creatinine of 1.73, slightly up from the patient's apparent baseline of 1.4. CBC is notable for a leukocytosis of 30,000 with neutrophilic predominance. Lactic acid is normal and troponin is undetectable. Urine is sent for analysis and reveals rare bacteria, small leukocytes, and negative nitrite. The left elbow and forearm are noted to be swollen and radiographs were obtained but very official read remains pending. Patient is remained hemodynamically stable in the emergency department and will be admitted to the hospital for ongoing evaluation and management of his acute onset of generalized weakness with left upper extremity swelling and pain.  Where does patient live?   Assistant living facility     Can patient participate in ADLs?  Some   Review of Systems:   General: no fevers, sweats, weight change, or poor appetite. Chills, fatigue HEENT: no blurry vision, hearing changes or sore throat Pulm: no dyspnea, cough, or wheeze CV: no chest pain or palpitations Abd: no nausea, vomiting, abdominal pain, diarrhea, or constipation GU: no dysuria, hematuria, increased urinary frequency, or urgency  Ext: no leg edema Neuro: no focal weakness, numbness, or tingling, no vision change or hearing loss Skin: no rash, no wounds MSK: No muscle spasm, no deformity, left elbow swelling and pain; right thumb pain  Heme: No easy bruising or bleeding Travel history: No recent long distant travel    Allergy: No Known Allergies  Past Medical History  Diagnosis Date  . Hypertension   . Convulsions (HCC)   . Psychosis   . Urinary incontinence     History reviewed. No pertinent past surgical history.  Social History:  reports that he has quit smoking. He does not have any smokeless tobacco history on file. He reports that he does not drink alcohol or use illicit drugs.  Family History: History reviewed. No pertinent family history.   Prior to Admission medications   Medication Sig Start Date End Date Taking? Authorizing Provider  amLODipine (NORVASC) 10 MG tablet Take 10 mg by mouth daily.   Yes Historical Provider, MD  ammonium lactate (LAC-HYDRIN) 12 % lotion Apply 1 application topically as needed for dry skin.   Yes Historical Provider, MD  atenolol (TENORMIN) 100 MG tablet Take 100 mg by mouth daily.   Yes Historical Provider, MD  benztropine (COGENTIN) 1 MG tablet Take 1 mg by  mouth 2 (two) times daily.   Yes Historical Provider, MD  carbamazepine (TEGRETOL XR) 400 MG 12 hr tablet Take 400 mg by mouth 2 (two) times daily.   Yes Historical Provider, MD  furosemide (LASIX) 40 MG tablet Take 40 mg by mouth daily.   Yes Historical Provider, MD  lisinopril  (PRINIVIL,ZESTRIL) 40 MG tablet Take 40 mg by mouth daily.   Yes Historical Provider, MD  OLANZapine (ZYPREXA) 5 MG tablet Take 5 mg by mouth daily.   Yes Historical Provider, MD  oxybutynin (DITROPAN) 5 MG tablet Take 5 mg by mouth 2 (two) times daily.   Yes Historical Provider, MD  pantoprazole (PROTONIX) 40 MG tablet Take 40 mg by mouth daily.   Yes Historical Provider, MD  potassium chloride (K-DUR) 10 MEQ tablet Take 20 mEq by mouth 4 (four) times daily.   Yes Historical Provider, MD  aspirin 325 MG tablet Take 1 tablet (325 mg total) by mouth daily. Patient not taking: Reported on 12/19/2015 09/11/15   Kari Baars, MD  atorvastatin (LIPITOR) 80 MG tablet Take 1 tablet (80 mg total) by mouth daily. Patient not taking: Reported on 12/19/2015 09/11/15   Kari Baars, MD    Physical Exam: Filed Vitals:   12/19/15 1334 12/19/15 1400 12/19/15 1415 12/19/15 1430  BP: 122/78 114/85  120/79  Pulse: 94 93 92 92  Temp: 99.3 F (37.4 C)     TempSrc: Oral     Resp: Height:  (1.803 m)     Weight: 88.451 kg (195 lb)     SpO2: 100% 94% 95% 89%   General: Not in acute distress HEENT:       Eyes: PERRL, EOMI, no scleral icterus or conjunctival pallor.       ENT: No discharge from the ears or nose, no pharyngeal ulcers, oral mucosa moist        Neck: No JVD, no bruit, no appreciable mass Heme: No cervical adenopathy, no pallor Cardiac: S1/S2, RRR, No murmurs, No gallops or rubs. Pulm: Good air movement bilaterally. No rales, wheezing, rhonchi or rubs. Abd: Soft, nondistended, nontender, no rebound pain or gaurding, BS present. Ext: No LE edema bilaterally. 2+DP/PT pulse bilaterally. Musculoskeletal:  Left elbow with edema, heat, tenderness; no wound or drainage, no appreciable fluctuance; no overlying erythema   Skin: No rashes or wounds on exposed surfaces  Neuro: Alert, oriented to person and hospital only, cranial nerves II-XII grossly intact, muscle strength 5/5 in  all extremities, sensation to light touch intact. Brachial reflex 2+ bilaterally. Knee reflex 2+ bilaterally. Negative Babinski's sign. Normal finger to nose test. No focal findings Psych: Patient is not overtly psychotic, denies suicidal or homocidal ideation, no active hallucinations.  Labs on Admission:  Basic Metabolic Panel:  Recent Labs Lab 12/19/15 1413  NA 141  K 3.4*  CL 105  CO2 27  GLUCOSE 111*  BUN 22*  CREATININE 1.73*  CALCIUM 8.6*   Liver Function Tests:  Recent Labs Lab 12/19/15 1413  AST 41  ALT 23  ALKPHOS 112  BILITOT 1.1  PROT 8.7*  ALBUMIN 4.1   No results for input(s): LIPASE, AMYLASE in the last 168 hours. No results for input(s): AMMONIA in the last 168 hours. CBC:  Recent Labs Lab 12/19/15 1413  WBC 30.1*  NEUTROABS 26.0*  HGB 13.8  HCT 41.7  MCV 93.7  PLT 222   Cardiac Enzymes: No results for input(s): CKTOTAL, CKMB, CKMBINDEX, TROPONINI in the last 168 hours.  BNP (last 3 results) No results for input(s): BNP in the last 8760 hours.  ProBNP (last 3 results) No results for input(s): PROBNP in the last 8760 hours.  CBG: No results for input(s): GLUCAP in the last 168 hours.  Radiological Exams on Admission: Dg Elbow Complete Left  12/19/2015  CLINICAL DATA:  LEFT elbow pain and swelling.  Symptoms for 1 week. EXAM: LEFT ELBOW - COMPLETE 3+ VIEW COMPARISON:  11/18/2015. FINDINGS: There is no evidence of fracture, dislocation, or joint effusion. There is no evidence of arthropathy. There is a posteriorly projecting exostosis from the olecranon which appears truncated and comparison with the previous radiograph in February. This is associated with marked soft tissue swelling of not only the upper arm but proximal forearm. Olecranon bursitis, with or without infection of the bone, is suspected. IMPRESSION: Cellulitis, olecranon bursitis without effusion, and possible osseous erosion of a posteriorly projecting olecranon spur. Correlate  clinically for osteomyelitis. Electronically Signed   By: Elsie StainJohn T Curnes M.D.   On: 12/19/2015 17:16   Mr Brain Wo Contrast  12/19/2015  CLINICAL DATA:  Weakness EXAM: MRI HEAD WITHOUT CONTRAST TECHNIQUE: Multiplanar, multiecho pulse sequences of the brain and surrounding structures were obtained without intravenous contrast. COMPARISON:  MRI 09/10/2015 FINDINGS: Negative for acute infarct. Marked cerebellar atrophy similar to the prior study. Cerebral hemispheres show normal volume. Periventricular white matter hyperintensity similar to the prior study and likely related to chronic microvascular ischemia. 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 Negative for intracranial hemorrhage. Negative for mass or edema. No shift of the midline structures. Mucosal edema paranasal sinuses. Normal pituitary. Normal skullbase. IMPRESSION: Negative for acute infarct. Marked cerebellar atrophy unchanged from the prior study. This may be related to pharmaceutical side effects. Alcohol and congenital causes also are possible. Chronic ischemic changes as above are stable Mucosal edema paranasal sinuses. Electronically Signed   By: Marlan Palauharles  Clark M.D.   On: 12/19/2015 15:36   Dg Chest Portable 1 View  12/19/2015  CLINICAL DATA:  Weakness EXAM: PORTABLE CHEST 1 VIEW COMPARISON:  None. FINDINGS: The heart size and mediastinal contours are within normal limits. Both lungs are clear. The visualized skeletal structures are unremarkable. IMPRESSION: No active disease. Electronically Signed   By: Alcide CleverMark  Lukens M.D.   On: 12/19/2015 14:37    EKG: Independently reviewed.  Abnormal findings:  Sinus rhythm, inferior Q-waves  Assessment/Plan  1. Generalized weakness  - Suspected secondary to the skin/soft tissue infection   - If fails to improve with treatment of the infectious process, may need to extend the workup  2. Cellulitis with left arm pain, swelling - Left elbow and proximal forearm hot, tender, edematous;  no erythema or fluctuance appreciated  - Radiographs suggestive of cellulitis with possible bursitis; subtle olecranon bony change possibly represents osteo  - Blood cultures incubating  - Started on empiric Rocephin for moderate non-purulent cellulitis  - Trend lactate, inflammatory markers, procalcitonin   3. Hypertension  - At goal currently  - Continue home-dose Norvasc, atenolol  - Hold lisinopril until renal fxn stabilizes    4. CKD stage III  - SCr 1.73 on admission, up from his apparent baseline of ~1.4  - Holding Lasix and losartan tonight    5. Hypokalemia  - Serum potassium 3.4 on admission  - No EKG changes  - Replacing PO - Check mag level and replete prn    DVT ppx: SQ Heparin    Code Status: Full code Family Communication: None at bed side.  Disposition Plan: Admit to inpatient   Date of Service 12/19/2015    Briscoe Deutscher, MD [facility] Pager [phone]  If 7PM-7AM, please contact night-coverage [email or web address] Password [facility] 12/19/2015, 5:27 PM

## 2015-12-19 NOTE — ED Notes (Signed)
Administrator called this RN and stated " Ryan Morton had a similar episode 4-5 months ago and and was worked up at your hospital, nothing was found and he got better in 3-4 days after coming home.

## 2015-12-19 NOTE — ED Notes (Signed)
Pt transported to MRI 

## 2015-12-20 DIAGNOSIS — L03114 Cellulitis of left upper limb: Principal | ICD-10-CM

## 2015-12-20 DIAGNOSIS — M7032 Other bursitis of elbow, left elbow: Secondary | ICD-10-CM | POA: Diagnosis present

## 2015-12-20 LAB — BASIC METABOLIC PANEL
Anion gap: 9 (ref 5–15)
BUN: 21 mg/dL — ABNORMAL HIGH (ref 6–20)
CHLORIDE: 113 mmol/L — AB (ref 101–111)
CO2: 26 mmol/L (ref 22–32)
Calcium: 8.5 mg/dL — ABNORMAL LOW (ref 8.9–10.3)
Creatinine, Ser: 1.69 mg/dL — ABNORMAL HIGH (ref 0.61–1.24)
GFR, EST AFRICAN AMERICAN: 49 mL/min — AB (ref >60)
GFR, EST NON AFRICAN AMERICAN: 42 mL/min — AB (ref >60)
Glucose, Bld: 121 mg/dL — ABNORMAL HIGH (ref 65–99)
POTASSIUM: 3.7 mmol/L (ref 3.5–5.1)
SODIUM: 148 mmol/L — AB (ref 135–145)

## 2015-12-20 LAB — CBC WITH DIFFERENTIAL/PLATELET
BASOS ABS: 0 10*3/uL (ref 0.0–0.1)
Basophils Relative: 0 %
EOS ABS: 0 10*3/uL (ref 0.0–0.7)
EOS PCT: 0 %
HCT: 39.8 % (ref 39.0–52.0)
HEMOGLOBIN: 13 g/dL (ref 13.0–17.0)
LYMPHS ABS: 2.4 10*3/uL (ref 0.7–4.0)
LYMPHS PCT: 9 %
MCH: 31 pg (ref 26.0–34.0)
MCHC: 32.7 g/dL (ref 30.0–36.0)
MCV: 95 fL (ref 78.0–100.0)
Monocytes Absolute: 1.8 10*3/uL — ABNORMAL HIGH (ref 0.1–1.0)
Monocytes Relative: 7 %
NEUTROS PCT: 84 %
Neutro Abs: 22.2 10*3/uL — ABNORMAL HIGH (ref 1.7–7.7)
PLATELETS: 203 10*3/uL (ref 150–400)
RBC: 4.19 MIL/uL — AB (ref 4.22–5.81)
RDW: 14 % (ref 11.5–15.5)
WBC: 26.4 10*3/uL — AB (ref 4.0–10.5)

## 2015-12-20 LAB — GLUCOSE, CAPILLARY: Glucose-Capillary: 116 mg/dL — ABNORMAL HIGH (ref 65–99)

## 2015-12-20 MED ORDER — SODIUM CHLORIDE 0.9 % IV SOLN
INTRAVENOUS | Status: DC
  Administered 2015-12-20 – 2015-12-24 (×9): via INTRAVENOUS

## 2015-12-20 NOTE — Consult Note (Addendum)
CONSULT   Requested by Dr. Shaune PollackEd Morton  Reason for consultation swelling left elbow with cellulitis and bursitis presumed infection  Chief complaint pain swelling left elbow  Ryan FreudMatthew E Morton ZOX:[id number] DOB: 05/10/1956 DOA: 12/19/2015  This was taken from the medical record secondary to patient's IQ and mental status   Referring physician: ED physician PCP:  Kari BaarsEdward Hawkins, MD Specialists: None listed   Chief Complaint: Generalized weakness  HPI: Ryan Morton is a 60 y.o. male with PMH of seizure disorder, unspecified psychosis, and hypertension who presents from his assisted living facility with generalized weakness. At baseline, the patient is apparently nonambulatory for unknown reasons, but was noted by ALF personnel to be generally weak for the past day, exhibiting difficulty feeding himself or sitting up in bed. There has been no fevers, cough, vomiting, or diarrhea noted. There is been no witnessed fall, apparently trauma, or recent seizure-like activity. Patient has complained of left elbow and forearm pain, as well as pain in the right thumb, but has otherwise been without complaints. He was transported to the ED for evaluation of his acute onset weakness.  In ED, patient was found to be afebrile, saturating well on room air, and with vital signs stable. MRI of the brain was performed and there are no acute findings noted. Chest x-ray is negative for acute cardiopulmonary disease. CMP is notable for a mild hypokalemia and a serum creatinine of 1.73, slightly up from the patient's apparent baseline of 1.4. CBC is notable for a leukocytosis of 30,000 with neutrophilic predominance. Lactic acid is normal and troponin is undetectable. Urine is sent for analysis and reveals rare bacteria, small leukocytes, and negative nitrite. The left elbow and forearm are noted to be swollen and radiographs were obtained but very official read remains pending. Patient is remained hemodynamically stable in  the emergency department and will be admitted to the hospital for ongoing evaluation and management of his acute onset of generalized weakness with left upper extremity swelling and pain.    Past Medical History  Diagnosis Date  . Hypertension   . Convulsions (HCC)   . Psychosis   . Urinary incontinence    History reviewed. No pertinent past surgical history.  History reviewed. No pertinent family history.   Social History  Substance Use Topics  . Smoking status: Former Games developermoker  . Smokeless tobacco: None  . Alcohol Use: No     Comment: former    Where does patient live?  Assistant living facility     Can patient participate in ADLs?  Some   General: NO sweats, weight change, or poor appetite. Chills, fatigue HEENT: no blurry vision, hearing changes or sore throat Pulm: no dyspnea, cough, or wheeze CV: no chest pain or palpitations Abd: no nausea, vomiting, abdominal pain, diarrhea, or constipation GU: no dysuria, hematuria, increased urinary frequency, or urgency  Ext: no leg edema Neuro: no focal weakness, numbness, or tingling, no vision change or hearing loss,  on presentation the patient was confused and not in his normal mental state according to his sister  Skin: no rash, no wounds MSK: No muscle spasm, no deformity, left elbow swelling and pain; right thumb pain  Heme: No easy bruising or bleeding  Physical Exam  Constitutional: He appears well-developed and well-nourished. No distress.  HENT:  Head: Normocephalic and atraumatic.  Mouth/Throat: No oropharyngeal exudate.  Eyes: Conjunctivae and EOM are normal. Right eye exhibits no discharge. Left eye exhibits no discharge. No scleral icterus.  Neck: Normal range  of motion. Neck supple. No JVD present. No tracheal deviation present.  Cardiovascular: Normal rate and intact distal pulses.   Pulmonary/Chest: Effort normal.  Abdominal: He exhibits no distension.  Musculoskeletal:       Left shoulder: He exhibits  decreased range of motion and tenderness. He exhibits no swelling, no crepitus, no deformity, no laceration, no pain, no spasm, normal pulse and normal strength.       Left elbow: He exhibits decreased range of motion and swelling. He exhibits no effusion, no deformity and no laceration. Tenderness found. Lateral epicondyle and olecranon process tenderness noted.       Right upper arm: Normal.       Arms: Lymphadenopathy:    He has no cervical adenopathy.       Left cervical: No superficial cervical, no deep cervical and no posterior cervical adenopathy present.    He has no axillary adenopathy.  Neurological: He is alert. He has normal reflexes. He displays no atrophy and no tremor. No cranial nerve deficit or sensory deficit. He exhibits normal muscle tone. He displays no seizure activity.  Skin: Skin is warm and dry. He is not diaphoretic. No erythema.     Psychiatric: His mood appears anxious. His affect is not angry. His speech is slurred. He is slowed. He is not agitated. Cognition and memory are impaired. He does not exhibit a depressed mood. He is noncommunicative.  The patient has verbal deficiencies but does communicate somewhat respond well to questions about distinguishing painful areas from nonpainful areas but could not give a full history        Plain films show soft tissue swelling. There is questionable erosion of olecranon spur but the previous x-ray from February shows no change from this x-ray when magnification is used to evaluate the spur  There is soft tissue swelling no effusion. Soft tissue swelling consistent with olecranon bursitis  Impression most of the tenderness is over the lateral soft tissues consistent with a cellulitis with induration and a surrounding olecranon bursitis.  Recommend continue IV antibiotics and see what response we get. It would be prudent not to operate on this patient but if the antibiotics do not prove to resolve the infection then  incision and drainage would be appropriate

## 2015-12-20 NOTE — Progress Notes (Signed)
Subjective: He says he feels a little better but his history is not reliable. He says he is still very weak. He has had significant fever overnight.  Objective: Vital signs in last 24 hours: Temp:  [98.7 F (37.1 C)-101.8 F (38.8 C)] 101.2 F (38.4 C) (03/25 0606) Pulse Rate:  [86-96] 91 (03/25 0606) Resp:  [15-23] 18 (03/25 0606) BP: (100-128)/(63-85) 100/63 mmHg (03/25 0606) SpO2:  [89 %-100 %] 92 % (03/25 0606) Weight:  [88.451 kg (195 lb)-96 kg (211 lb 10.3 oz)] 96 kg (211 lb 10.3 oz) (03/25 0606) Weight change:     Intake/Output from previous day: 03/24 0701 - 03/25 0700 In: 240 [P.O.:240] Out: 500 [Urine:500]  PHYSICAL EXAM General appearance: alert, cooperative and no distress Resp: clear to auscultation bilaterally Cardio: regular rate and rhythm, S1, S2 normal, no murmur, click, rub or gallop GI: soft, non-tender; bowel sounds normal; no masses,  no organomegaly Extremities: He has swelling and erythema of the left arm  Lab Results:  Results for orders placed or performed during the hospital encounter of 12/19/15 (from the past 48 hour(s))  I-stat troponin, ED     Status: None   Collection Time: 12/19/15  2:11 PM  Result Value Ref Range   Troponin i, poc 0.00 0.00 - 0.08 ng/mL   Comment 3            Comment: Due to the release kinetics of cTnI, a negative result within the first hours of the onset of symptoms does not rule out myocardial infarction with certainty. If myocardial infarction is still suspected, repeat the test at appropriate intervals.   CBC with Differential/Platelet     Status: Abnormal   Collection Time: 12/19/15  2:13 PM  Result Value Ref Range   WBC 30.1 (H) 4.0 - 10.5 K/uL    Comment: WHITE COUNT CONFIRMED ON SMEAR   RBC 4.45 4.22 - 5.81 MIL/uL   Hemoglobin 13.8 13.0 - 17.0 g/dL   HCT 41.7 39.0 - 52.0 %   MCV 93.7 78.0 - 100.0 fL   MCH 31.0 26.0 - 34.0 pg   MCHC 33.1 30.0 - 36.0 g/dL   RDW 13.5 11.5 - 15.5 %   Platelets 222 150 -  400 K/uL   Neutrophils Relative % 86 %   Neutro Abs 26.0 (H) 1.7 - 7.7 K/uL   Lymphocytes Relative 7 %   Lymphs Abs 2.1 0.7 - 4.0 K/uL   Monocytes Relative 6 %   Monocytes Absolute 1.9 (H) 0.1 - 1.0 K/uL   Eosinophils Relative 0 %   Eosinophils Absolute 0.0 0.0 - 0.7 K/uL   Basophils Relative 0 %   Basophils Absolute 0.0 0.0 - 0.1 K/uL  Comprehensive metabolic panel     Status: Abnormal   Collection Time: 12/19/15  2:13 PM  Result Value Ref Range   Sodium 141 135 - 145 mmol/L   Potassium 3.4 (L) 3.5 - 5.1 mmol/L   Chloride 105 101 - 111 mmol/L   CO2 27 22 - 32 mmol/L   Glucose, Bld 111 (H) 65 - 99 mg/dL   BUN 22 (H) 6 - 20 mg/dL   Creatinine, Ser 1.73 (H) 0.61 - 1.24 mg/dL   Calcium 8.6 (L) 8.9 - 10.3 mg/dL   Total Protein 8.7 (H) 6.5 - 8.1 g/dL   Albumin 4.1 3.5 - 5.0 g/dL   AST 41 15 - 41 U/L   ALT 23 17 - 63 U/L   Alkaline Phosphatase 112 38 - 126 U/L  Total Bilirubin 1.1 0.3 - 1.2 mg/dL   GFR calc non Af Amer 41 (L) >60 mL/min   GFR calc Af Amer 48 (L) >60 mL/min    Comment: (NOTE) The eGFR has been calculated using the CKD EPI equation. This calculation has not been validated in all clinical situations. eGFR's persistently <60 mL/min signify possible Chronic Kidney Disease.    Anion gap 9 5 - 15  Urinalysis, Routine w reflex microscopic (not at [facility])     Status: Abnormal   Collection Time: 12/19/15  2:38 PM  Result Value Ref Range   Color, Urine YELLOW YELLOW   APPearance CLEAR CLEAR   Specific Gravity, Urine 1.005 1.005 - 1.030   pH 6.5 5.0 - 8.0   Glucose, UA NEGATIVE NEGATIVE mg/dL   Hgb urine dipstick SMALL (A) NEGATIVE   Bilirubin Urine NEGATIVE NEGATIVE   Ketones, ur NEGATIVE NEGATIVE mg/dL   Protein, ur NEGATIVE NEGATIVE mg/dL   Nitrite NEGATIVE NEGATIVE   Leukocytes, UA SMALL (A) NEGATIVE  Urine microscopic-add on     Status: Abnormal   Collection Time: 12/19/15  2:38 PM  Result Value Ref Range   Squamous Epithelial / LPF 0-5 (A) NONE SEEN   WBC,  UA 0-5 0 - 5 WBC/hpf   RBC / HPF 0-5 0 - 5 RBC/hpf   Bacteria, UA RARE (A) NONE SEEN  Blood culture (routine x 2)     Status: None (Preliminary result)   Collection Time: 12/19/15  4:22 PM  Result Value Ref Range   Specimen Description BLOOD RIGHT ARM    Special Requests BOTTLES DRAWN AEROBIC AND ANAEROBIC 4CC EACH    Culture NO GROWTH < 24 HOURS    Report Status PENDING   Blood culture (routine x 2)     Status: None (Preliminary result)   Collection Time: 12/19/15  4:29 PM  Result Value Ref Range   Specimen Description LEFT ANTECUBITAL    Special Requests BOTTLES DRAWN AEROBIC AND ANAEROBIC 8CC EACH    Culture NO GROWTH < 24 HOURS    Report Status PENDING   I-Stat CG4 Lactic Acid, ED     Status: None   Collection Time: 12/19/15  4:39 PM  Result Value Ref Range   Lactic Acid, Venous 1.32 0.5 - 2.0 mmol/L  Procalcitonin - Baseline     Status: None   Collection Time: 12/19/15  5:42 PM  Result Value Ref Range   Procalcitonin 4.40 ng/mL    Comment:        Interpretation: PCT > 2 ng/mL: Systemic infection (sepsis) is likely, unless other causes are known. (NOTE)         ICU PCT Algorithm               Non ICU PCT Algorithm    ----------------------------     ------------------------------         PCT < 0.25 ng/mL                 PCT < 0.1 ng/mL     Stopping of antibiotics            Stopping of antibiotics       strongly encouraged.               strongly encouraged.    ----------------------------     ------------------------------       PCT level decrease by               PCT < 0.25 ng/mL       >=  80% from peak PCT       OR PCT 0.25 - 0.5 ng/mL          Stopping of antibiotics                                             encouraged.     Stopping of antibiotics           encouraged.    ----------------------------     ------------------------------       PCT level decrease by              PCT >= 0.25 ng/mL       < 80% from peak PCT        AND PCT >= 0.5 ng/mL             Continuing antibiotics                                               encouraged.       Continuing antibiotics            encouraged.    ----------------------------     ------------------------------     PCT level increase compared          PCT > 0.5 ng/mL         with peak PCT AND          PCT >= 0.5 ng/mL             Escalation of antibiotics                                          strongly encouraged.      Escalation of antibiotics        strongly encouraged.   Magnesium     Status: None   Collection Time: 12/19/15  5:42 PM  Result Value Ref Range   Magnesium 2.1 1.7 - 2.4 mg/dL  Protime-INR     Status: Abnormal   Collection Time: 12/19/15  5:42 PM  Result Value Ref Range   Prothrombin Time 16.8 (H) 11.6 - 15.2 seconds   INR 1.35 0.00 - 1.49  APTT     Status: None   Collection Time: 12/19/15  5:42 PM  Result Value Ref Range   aPTT 27 24 - 37 seconds  TSH     Status: None   Collection Time: 12/19/15  5:42 PM  Result Value Ref Range   TSH 0.408 0.350 - 4.500 uIU/mL  Lactic acid, plasma     Status: None   Collection Time: 12/19/15  5:42 PM  Result Value Ref Range   Lactic Acid, Venous 1.4 0.5 - 2.0 mmol/L  Uric acid     Status: Abnormal   Collection Time: 12/19/15  5:42 PM  Result Value Ref Range   Uric Acid, Serum 8.2 (H) 4.4 - 7.6 mg/dL  Sedimentation rate     Status: Abnormal   Collection Time: 12/19/15  5:42 PM  Result Value Ref Range   Sed Rate 40 (H) 0 - 16 mm/hr  C-reactive protein     Status: Abnormal   Collection Time: 12/19/15  5:42 PM  Result Value Ref Range   CRP 21.0 (H) <1.0 mg/dL    Comment: Performed at [facility]  Carbamazepine level, total     Status: None   Collection Time: 12/19/15  5:42 PM  Result Value Ref Range   Carbamazepine Lvl 9.5 4.0 - 12.0 ug/mL  MRSA PCR Screening     Status: Abnormal   Collection Time: 12/19/15  8:02 PM  Result Value Ref Range   MRSA by PCR POSITIVE (A) NEGATIVE    Comment:        The GeneXpert MRSA  Assay (FDA approved for NASAL specimens only), is one component of a comprehensive MRSA colonization surveillance program. It is not intended to diagnose MRSA infection nor to guide or monitor treatment for MRSA infections. RESULT CALLED TO, READ BACK BY AND VERIFIED WITH: WILSON,A. AT 2335 ON 12/19/2015 BY AGUNDIZ,E.   Lactic acid, plasma     Status: None   Collection Time: 12/19/15  9:22 PM  Result Value Ref Range   Lactic Acid, Venous 1.6 0.5 - 2.0 mmol/L  Basic metabolic panel     Status: Abnormal   Collection Time: 12/20/15  6:32 AM  Result Value Ref Range   Sodium 148 (H) 135 - 145 mmol/L   Potassium 3.7 3.5 - 5.1 mmol/L   Chloride 113 (H) 101 - 111 mmol/L   CO2 26 22 - 32 mmol/L   Glucose, Bld 121 (H) 65 - 99 mg/dL   BUN 21 (H) 6 - 20 mg/dL   Creatinine, Ser 5.70 (H) 0.61 - 1.24 mg/dL   Calcium 8.5 (L) 8.9 - 10.3 mg/dL   GFR calc non Af Amer 42 (L) >60 mL/min   GFR calc Af Amer 49 (L) >60 mL/min    Comment: (NOTE) The eGFR has been calculated using the CKD EPI equation. This calculation has not been validated in all clinical situations. eGFR's persistently <60 mL/min signify possible Chronic Kidney Disease.    Anion gap 9 5 - 15  CBC WITH DIFFERENTIAL     Status: Abnormal   Collection Time: 12/20/15  6:32 AM  Result Value Ref Range   WBC 26.4 (H) 4.0 - 10.5 K/uL   RBC 4.19 (L) 4.22 - 5.81 MIL/uL   Hemoglobin 13.0 13.0 - 17.0 g/dL   HCT 81.6 46.7 - 25.9 %   MCV 95.0 78.0 - 100.0 fL   MCH 31.0 26.0 - 34.0 pg   MCHC 32.7 30.0 - 36.0 g/dL   RDW 12.6 44.5 - 52.9 %   Platelets 203 150 - 400 K/uL   Neutrophils Relative % 84 %   Neutro Abs 22.2 (H) 1.7 - 7.7 K/uL   Lymphocytes Relative 9 %   Lymphs Abs 2.4 0.7 - 4.0 K/uL   Monocytes Relative 7 %   Monocytes Absolute 1.8 (H) 0.1 - 1.0 K/uL   Eosinophils Relative 0 %   Eosinophils Absolute 0.0 0.0 - 0.7 K/uL   Basophils Relative 0 %   Basophils Absolute 0.0 0.0 - 0.1 K/uL  Glucose, capillary     Status: Abnormal    Collection Time: 12/20/15  8:05 AM  Result Value Ref Range   Glucose-Capillary 116 (H) 65 - 99 mg/dL   Comment 1 Notify RN     ABGS No results for input(s): PHART, PO2ART, TCO2, HCO3 in the last 72 hours.  Invalid input(s): PCO2 CULTURES Recent Results (from the past 240 hour(s))  Blood culture (routine x 2)     Status: None (Preliminary  result)   Collection Time: 12/19/15  4:22 PM  Result Value Ref Range Status   Specimen Description BLOOD RIGHT ARM  Final   Special Requests BOTTLES DRAWN AEROBIC AND ANAEROBIC 4CC EACH  Final   Culture NO GROWTH < 24 HOURS  Final   Report Status PENDING  Incomplete  Blood culture (routine x 2)     Status: None (Preliminary result)   Collection Time: 12/19/15  4:29 PM  Result Value Ref Range Status   Specimen Description LEFT ANTECUBITAL  Final   Special Requests BOTTLES DRAWN AEROBIC AND ANAEROBIC 8CC EACH  Final   Culture NO GROWTH < 24 HOURS  Final   Report Status PENDING  Incomplete  MRSA PCR Screening     Status: Abnormal   Collection Time: 12/19/15  8:02 PM  Result Value Ref Range Status   MRSA by PCR POSITIVE (A) NEGATIVE Final    Comment:        The GeneXpert MRSA Assay (FDA approved for NASAL specimens only), is one component of a comprehensive MRSA colonization surveillance program. It is not intended to diagnose MRSA infection nor to guide or monitor treatment for MRSA infections. RESULT CALLED TO, READ BACK BY AND VERIFIED WITH: WILSON,A. AT 2335 ON 12/19/2015 BY AGUNDIZ,E.    Studies/Results: Dg Elbow Complete Left  12/19/2015  CLINICAL DATA:  LEFT elbow pain and swelling.  Symptoms for 1 week. EXAM: LEFT ELBOW - COMPLETE 3+ VIEW COMPARISON:  11/18/2015. FINDINGS: There is no evidence of fracture, dislocation, or joint effusion. There is no evidence of arthropathy. There is a posteriorly projecting exostosis from the olecranon which appears truncated and comparison with the previous radiograph in February. This is  associated with marked soft tissue swelling of not only the upper arm but proximal forearm. Olecranon bursitis, with or without infection of the bone, is suspected. IMPRESSION: Cellulitis, olecranon bursitis without effusion, and possible osseous erosion of a posteriorly projecting olecranon spur. Correlate clinically for osteomyelitis. Electronically Signed   By: Staci Righter M.D.   On: 12/19/2015 17:16   Mr Brain Wo Contrast  12/19/2015  CLINICAL DATA:  Weakness EXAM: MRI HEAD WITHOUT CONTRAST TECHNIQUE: Multiplanar, multiecho pulse sequences of the brain and surrounding structures were obtained without intravenous contrast. COMPARISON:  MRI 09/10/2015 FINDINGS: Negative for acute infarct. Marked cerebellar atrophy similar to the prior study. Cerebral hemispheres show normal volume. Periventricular white matter hyperintensity similar to the prior study and likely related to chronic microvascular ischemia. 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 Negative for intracranial hemorrhage. Negative for mass or edema. No shift of the midline structures. Mucosal edema paranasal sinuses. Normal pituitary. Normal skullbase. IMPRESSION: Negative for acute infarct. Marked cerebellar atrophy unchanged from the prior study. This may be related to pharmaceutical side effects. Alcohol and congenital causes also are possible. Chronic ischemic changes as above are stable Mucosal edema paranasal sinuses. Electronically Signed   By: Franchot Gallo M.D.   On: 12/19/2015 15:36   Dg Chest Portable 1 View  12/19/2015  CLINICAL DATA:  Weakness EXAM: PORTABLE CHEST 1 VIEW COMPARISON:  None. FINDINGS: The heart size and mediastinal contours are within normal limits. Both lungs are clear. The visualized skeletal structures are unremarkable. IMPRESSION: No active disease. Electronically Signed   By: Inez Catalina M.D.   On: 12/19/2015 14:37    Medications:  Prior to Admission:  Prescriptions prior to admission   Medication Sig Dispense Refill Last Dose  . amLODipine (NORVASC) 10 MG tablet Take 10 mg by mouth  daily.   12/19/2015 at Unknown time  . ammonium lactate (LAC-HYDRIN) 12 % lotion Apply 1 application topically as needed for dry skin.   12/19/2015 at Unknown time  . atenolol (TENORMIN) 100 MG tablet Take 100 mg by mouth daily.   12/19/2015 at 0800  . benztropine (COGENTIN) 1 MG tablet Take 1 mg by mouth 2 (two) times daily.   12/19/2015 at Unknown time  . carbamazepine (TEGRETOL XR) 400 MG 12 hr tablet Take 400 mg by mouth 2 (two) times daily.   12/19/2015 at Unknown time  . furosemide (LASIX) 40 MG tablet Take 40 mg by mouth daily.   12/19/2015 at Unknown time  . lisinopril (PRINIVIL,ZESTRIL) 40 MG tablet Take 40 mg by mouth daily.   12/19/2015 at Unknown time  . OLANZapine (ZYPREXA) 5 MG tablet Take 5 mg by mouth daily.   12/19/2015 at Unknown time  . oxybutynin (DITROPAN) 5 MG tablet Take 5 mg by mouth 2 (two) times daily.   12/19/2015 at Unknown time  . pantoprazole (PROTONIX) 40 MG tablet Take 40 mg by mouth daily.   12/19/2015 at Unknown time  . potassium chloride (K-DUR) 10 MEQ tablet Take 20 mEq by mouth 4 (four) times daily.   12/19/2015 at Unknown time  . aspirin 325 MG tablet Take 1 tablet (325 mg total) by mouth daily. (Patient not taking: Reported on 12/19/2015) 30 tablet 12   . atorvastatin (LIPITOR) 80 MG tablet Take 1 tablet (80 mg total) by mouth daily. (Patient not taking: Reported on 12/19/2015) 30 tablet 12    Scheduled: . amLODipine  10 mg Oral Daily  . atenolol  100 mg Oral Daily  . benztropine  1 mg Oral BID  . carbamazepine  400 mg Oral BID  . cefTRIAXone (ROCEPHIN)  IV  2 g Intravenous Q24H  . Chlorhexidine Gluconate Cloth  6 each Topical Q0600  . heparin  5,000 Units Subcutaneous 3 times per day  . mupirocin ointment  1 application Nasal BID  . OLANZapine  5 mg Oral Daily  . oxybutynin  5 mg Oral BID  . pantoprazole  40 mg Oral Daily  . potassium chloride SA  20 mEq Oral QID   . vancomycin  1,000 mg Intravenous Once   Continuous: . sodium chloride 75 mL/hr at 12/20/15 1020   UTM:[id number] **OR** acetaminophen, ammonium lactate, bisacodyl, HYDROcodone-acetaminophen, ondansetron **OR** ondansetron (ZOFRAN) IV, polyethylene glycol  Assesment: He was admitted with generalized weakness and this is thought to be related to cellulitis and perhaps infected bursa at his left arm. I think he may be a bit dehydrated. Principal Problem:   Generalized weakness Active Problems:   Hypertension   Leukocytosis   CKD (chronic kidney disease) stage 3, GFR 30-59 ml/min   Hypokalemia   Left arm swelling   Episode of generalized weakness   Other malaise and fatigue    Plan: Continue Rocephin and vancomycin. Await cultures. Orthopedic consultation. I will give him some IV fluids at least for 24 hours.    LOS: 1 day   Seena Ritacco L 12/20/2015, 10:53 AM

## 2015-12-21 LAB — BASIC METABOLIC PANEL
ANION GAP: 8 (ref 5–15)
BUN: 18 mg/dL (ref 6–20)
CHLORIDE: 111 mmol/L (ref 101–111)
CO2: 24 mmol/L (ref 22–32)
Calcium: 7.8 mg/dL — ABNORMAL LOW (ref 8.9–10.3)
Creatinine, Ser: 1.6 mg/dL — ABNORMAL HIGH (ref 0.61–1.24)
GFR calc Af Amer: 52 mL/min — ABNORMAL LOW (ref >60)
GFR calc non Af Amer: 45 mL/min — ABNORMAL LOW (ref >60)
Glucose, Bld: 115 mg/dL — ABNORMAL HIGH (ref 65–99)
POTASSIUM: 3.6 mmol/L (ref 3.5–5.1)
SODIUM: 143 mmol/L (ref 135–145)

## 2015-12-21 LAB — CBC WITH DIFFERENTIAL/PLATELET
BASOS ABS: 0 10*3/uL (ref 0.0–0.1)
Basophils Relative: 0 %
EOS ABS: 0.1 10*3/uL (ref 0.0–0.7)
Eosinophils Relative: 1 %
HCT: 33.9 % — ABNORMAL LOW (ref 39.0–52.0)
HEMOGLOBIN: 11.2 g/dL — AB (ref 13.0–17.0)
LYMPHS ABS: 3.1 10*3/uL (ref 0.7–4.0)
LYMPHS PCT: 14 %
MCH: 31.3 pg (ref 26.0–34.0)
MCHC: 33 g/dL (ref 30.0–36.0)
MCV: 94.7 fL (ref 78.0–100.0)
Monocytes Absolute: 1.5 10*3/uL — ABNORMAL HIGH (ref 0.1–1.0)
Monocytes Relative: 7 %
NEUTROS PCT: 78 %
Neutro Abs: 17.6 10*3/uL — ABNORMAL HIGH (ref 1.7–7.7)
Platelets: 193 10*3/uL (ref 150–400)
RBC: 3.58 MIL/uL — AB (ref 4.22–5.81)
RDW: 13.7 % (ref 11.5–15.5)
WBC: 22.4 10*3/uL — AB (ref 4.0–10.5)

## 2015-12-21 LAB — PROCALCITONIN: Procalcitonin: 2.59 ng/mL

## 2015-12-21 LAB — HIV ANTIBODY (ROUTINE TESTING W REFLEX): HIV Screen 4th Generation wRfx: NONREACTIVE

## 2015-12-21 LAB — GLUCOSE, CAPILLARY: GLUCOSE-CAPILLARY: 111 mg/dL — AB (ref 65–99)

## 2015-12-21 NOTE — Progress Notes (Signed)
CBC Latest Ref Rng 12/21/2015 12/20/2015 12/19/2015  WBC 4.0 - 10.5 K/uL 22.4(H) 26.4(H) 30.1(H)  Hemoglobin 13.0 - 17.0 g/dL 11.2(L) 13.0 13.8  Hematocrit 39.0 - 52.0 % 33.9(L) 39.8 41.7  Platelets 150 - 400 K/uL 193 203 222    BMP Latest Ref Rng 12/21/2015 12/20/2015 12/19/2015  Glucose 65 - 99 mg/dL 191(Y115(H) 782(N121(H) 562(Z111(H)  BUN 6 - 20 mg/dL 18 30(Q21(H) 65(H22(H)  Creatinine 0.61 - 1.24 mg/dL 8.46(N1.60(H) 6.29(B1.69(H) 2.84(X1.73(H)  Sodium 135 - 145 mmol/L 143 148(H) 141  Potassium 3.5 - 5.1 mmol/L 3.6 3.7 3.4(L)  Chloride 101 - 111 mmol/L 111 113(H) 105  CO2 22 - 32 mmol/L 24 26 27   Calcium 8.9 - 10.3 mg/dL 7.8(L) 8.5(L) 8.6(L)   BP 101/70 mmHg  Pulse 90  Temp(Src) 100.5 F (38.1 C) (Oral)  Resp 16  Ht 5\' 11"  (1.803 m)  Wt 203 lb 14.8 oz (92.5 kg)  BMI 28.45 kg/m2  SpO2 100%  T=105  WBC still elevated i do not feel an abscess so I will get further imaging to see if one is present  The bursa has very little fluid in it, so aspiration is not necessary   MRI left elbow

## 2015-12-21 NOTE — Progress Notes (Signed)
Subjective: He says he feels okay. Dr. Ruthe Mannan help is noted and appreciated  Objective: Vital signs in last 24 hours: Temp:  [98.6 F (37 C)-100.7 F (38.2 C)] 100.5 F (38.1 C) (03/26 0411) Pulse Rate:  [90-95] 90 (03/26 0411) Resp:  [16-20] 16 (03/26 0411) BP: (83-101)/(50-70) 101/70 mmHg (03/26 0411) SpO2:  [98 %-100 %] 100 % (03/26 0411) Weight:  [92.5 kg (203 lb 14.8 oz)] 92.5 kg (203 lb 14.8 oz) (03/26 0411) Weight change: 4.049 kg (8 lb 14.8 oz)    Intake/Output from previous day: 03/25 0701 - 03/26 0700 In: 2600 [P.O.:1200; I.V.:1400] Out: 1450 [Urine:1450]  PHYSICAL EXAM General appearance: alert, cooperative and no distress Resp: clear to auscultation bilaterally Cardio: regular rate and rhythm, S1, S2 normal, no murmur, click, rub or gallop GI: soft, non-tender; bowel sounds normal; no masses,  no organomegaly Extremities: He still has swelling of his left upper extremity but it seems better.  Lab Results:  Results for orders placed or performed during the hospital encounter of 12/19/15 (from the past 48 hour(s))  I-stat troponin, ED     Status: None   Collection Time: 12/19/15  2:11 PM  Result Value Ref Range   Troponin i, poc 0.00 0.00 - 0.08 ng/mL   Comment 3            Comment: Due to the release kinetics of cTnI, a negative result within the first hours of the onset of symptoms does not rule out myocardial infarction with certainty. If myocardial infarction is still suspected, repeat the test at appropriate intervals.   CBC with Differential/Platelet     Status: Abnormal   Collection Time: 12/19/15  2:13 PM  Result Value Ref Range   WBC 30.1 (H) 4.0 - 10.5 K/uL    Comment: WHITE COUNT CONFIRMED ON SMEAR   RBC 4.45 4.22 - 5.81 MIL/uL   Hemoglobin 13.8 13.0 - 17.0 g/dL   HCT 41.7 39.0 - 52.0 %   MCV 93.7 78.0 - 100.0 fL   MCH 31.0 26.0 - 34.0 pg   MCHC 33.1 30.0 - 36.0 g/dL   RDW 13.5 11.5 - 15.5 %   Platelets 222 150 - 400 K/uL   Neutrophils  Relative % 86 %   Neutro Abs 26.0 (H) 1.7 - 7.7 K/uL   Lymphocytes Relative 7 %   Lymphs Abs 2.1 0.7 - 4.0 K/uL   Monocytes Relative 6 %   Monocytes Absolute 1.9 (H) 0.1 - 1.0 K/uL   Eosinophils Relative 0 %   Eosinophils Absolute 0.0 0.0 - 0.7 K/uL   Basophils Relative 0 %   Basophils Absolute 0.0 0.0 - 0.1 K/uL  Comprehensive metabolic panel     Status: Abnormal   Collection Time: 12/19/15  2:13 PM  Result Value Ref Range   Sodium 141 135 - 145 mmol/L   Potassium 3.4 (L) 3.5 - 5.1 mmol/L   Chloride 105 101 - 111 mmol/L   CO2 27 22 - 32 mmol/L   Glucose, Bld 111 (H) 65 - 99 mg/dL   BUN 22 (H) 6 - 20 mg/dL   Creatinine, Ser 1.73 (H) 0.61 - 1.24 mg/dL   Calcium 8.6 (L) 8.9 - 10.3 mg/dL   Total Protein 8.7 (H) 6.5 - 8.1 g/dL   Albumin 4.1 3.5 - 5.0 g/dL   AST 41 15 - 41 U/L   ALT 23 17 - 63 U/L   Alkaline Phosphatase 112 38 - 126 U/L   Total Bilirubin 1.1 0.3 - 1.2  mg/dL   GFR calc non Af Amer 41 (L) >60 mL/min   GFR calc Af Amer 48 (L) >60 mL/min    Comment: (NOTE) The eGFR has been calculated using the CKD EPI equation. This calculation has not been validated in all clinical situations. eGFR's persistently <60 mL/min signify possible Chronic Kidney Disease.    Anion gap 9 5 - 15  Urinalysis, Routine w reflex microscopic (not at [facility])     Status: Abnormal   Collection Time: 12/19/15  2:38 PM  Result Value Ref Range   Color, Urine YELLOW YELLOW   APPearance CLEAR CLEAR   Specific Gravity, Urine 1.005 1.005 - 1.030   pH 6.5 5.0 - 8.0   Glucose, UA NEGATIVE NEGATIVE mg/dL   Hgb urine dipstick SMALL (A) NEGATIVE   Bilirubin Urine NEGATIVE NEGATIVE   Ketones, ur NEGATIVE NEGATIVE mg/dL   Protein, ur NEGATIVE NEGATIVE mg/dL   Nitrite NEGATIVE NEGATIVE   Leukocytes, UA SMALL (A) NEGATIVE  Urine microscopic-add on     Status: Abnormal   Collection Time: 12/19/15  2:38 PM  Result Value Ref Range   Squamous Epithelial / LPF 0-5 (A) NONE SEEN   WBC, UA 0-5 0 - 5 WBC/hpf    RBC / HPF 0-5 0 - 5 RBC/hpf   Bacteria, UA RARE (A) NONE SEEN  Blood culture (routine x 2)     Status: None (Preliminary result)   Collection Time: 12/19/15  4:22 PM  Result Value Ref Range   Specimen Description BLOOD RIGHT ARM    Special Requests BOTTLES DRAWN AEROBIC AND ANAEROBIC 4CC EACH    Culture NO GROWTH < 24 HOURS    Report Status PENDING   Blood culture (routine x 2)     Status: None (Preliminary result)   Collection Time: 12/19/15  4:29 PM  Result Value Ref Range   Specimen Description LEFT ANTECUBITAL    Special Requests BOTTLES DRAWN AEROBIC AND ANAEROBIC 8CC EACH    Culture NO GROWTH < 24 HOURS    Report Status PENDING   I-Stat CG4 Lactic Acid, ED     Status: None   Collection Time: 12/19/15  4:39 PM  Result Value Ref Range   Lactic Acid, Venous 1.32 0.5 - 2.0 mmol/L  Procalcitonin - Baseline     Status: None   Collection Time: 12/19/15  5:42 PM  Result Value Ref Range   Procalcitonin 4.40 ng/mL    Comment:        Interpretation: PCT > 2 ng/mL: Systemic infection (sepsis) is likely, unless other causes are known. (NOTE)         ICU PCT Algorithm               Non ICU PCT Algorithm    ----------------------------     ------------------------------         PCT < 0.25 ng/mL                 PCT < 0.1 ng/mL     Stopping of antibiotics            Stopping of antibiotics       strongly encouraged.               strongly encouraged.    ----------------------------     ------------------------------       PCT level decrease by               PCT < 0.25 ng/mL       >=  80% from peak PCT       OR PCT 0.25 - 0.5 ng/mL          Stopping of antibiotics                                             encouraged.     Stopping of antibiotics           encouraged.    ----------------------------     ------------------------------       PCT level decrease by              PCT >= 0.25 ng/mL       < 80% from peak PCT        AND PCT >= 0.5 ng/mL            Continuing antibiotics                                                encouraged.       Continuing antibiotics            encouraged.    ----------------------------     ------------------------------     PCT level increase compared          PCT > 0.5 ng/mL         with peak PCT AND          PCT >= 0.5 ng/mL             Escalation of antibiotics                                          strongly encouraged.      Escalation of antibiotics        strongly encouraged.   Magnesium     Status: None   Collection Time: 12/19/15  5:42 PM  Result Value Ref Range   Magnesium 2.1 1.7 - 2.4 mg/dL  Protime-INR     Status: Abnormal   Collection Time: 12/19/15  5:42 PM  Result Value Ref Range   Prothrombin Time 16.8 (H) 11.6 - 15.2 seconds   INR 1.35 0.00 - 1.49  APTT     Status: None   Collection Time: 12/19/15  5:42 PM  Result Value Ref Range   aPTT 27 24 - 37 seconds  TSH     Status: None   Collection Time: 12/19/15  5:42 PM  Result Value Ref Range   TSH 0.408 0.350 - 4.500 uIU/mL  Lactic acid, plasma     Status: None   Collection Time: 12/19/15  5:42 PM  Result Value Ref Range   Lactic Acid, Venous 1.4 0.5 - 2.0 mmol/L  Uric acid     Status: Abnormal   Collection Time: 12/19/15  5:42 PM  Result Value Ref Range   Uric Acid, Serum 8.2 (H) 4.4 - 7.6 mg/dL  Sedimentation rate     Status: Abnormal   Collection Time: 12/19/15  5:42 PM  Result Value Ref Range   Sed Rate 40 (H) 0 - 16 mm/hr  C-reactive protein     Status: Abnormal   Collection Time: 12/19/15  5:42 PM  Result Value Ref Range   CRP 21.0 (H) <1.0 mg/dL    Comment: Performed at [facility]  Carbamazepine level, total     Status: None   Collection Time: 12/19/15  5:42 PM  Result Value Ref Range   Carbamazepine Lvl 9.5 4.0 - 12.0 ug/mL  MRSA PCR Screening     Status: Abnormal   Collection Time: 12/19/15  8:02 PM  Result Value Ref Range   MRSA by PCR POSITIVE (A) NEGATIVE    Comment:        The GeneXpert MRSA Assay (FDA approved for NASAL  specimens only), is one component of a comprehensive MRSA colonization surveillance program. It is not intended to diagnose MRSA infection nor to guide or monitor treatment for MRSA infections. RESULT CALLED TO, READ BACK BY AND VERIFIED WITH: WILSON,A. AT 2335 ON 12/19/2015 BY AGUNDIZ,E.   Lactic acid, plasma     Status: None   Collection Time: 12/19/15  9:22 PM  Result Value Ref Range   Lactic Acid, Venous 1.6 0.5 - 2.0 mmol/L  Basic metabolic panel     Status: Abnormal   Collection Time: 12/20/15  6:32 AM  Result Value Ref Range   Sodium 148 (H) 135 - 145 mmol/L   Potassium 3.7 3.5 - 5.1 mmol/L   Chloride 113 (H) 101 - 111 mmol/L   CO2 26 22 - 32 mmol/L   Glucose, Bld 121 (H) 65 - 99 mg/dL   BUN 21 (H) 6 - 20 mg/dL   Creatinine, Ser 1.69 (H) 0.61 - 1.24 mg/dL   Calcium 8.5 (L) 8.9 - 10.3 mg/dL   GFR calc non Af Amer 42 (L) >60 mL/min   GFR calc Af Amer 49 (L) >60 mL/min    Comment: (NOTE) The eGFR has been calculated using the CKD EPI equation. This calculation has not been validated in all clinical situations. eGFR's persistently <60 mL/min signify possible Chronic Kidney Disease.    Anion gap 9 5 - 15  CBC WITH DIFFERENTIAL     Status: Abnormal   Collection Time: 12/20/15  6:32 AM  Result Value Ref Range   WBC 26.4 (H) 4.0 - 10.5 K/uL   RBC 4.19 (L) 4.22 - 5.81 MIL/uL   Hemoglobin 13.0 13.0 - 17.0 g/dL   HCT 39.8 39.0 - 52.0 %   MCV 95.0 78.0 - 100.0 fL   MCH 31.0 26.0 - 34.0 pg   MCHC 32.7 30.0 - 36.0 g/dL   RDW 14.0 11.5 - 15.5 %   Platelets 203 150 - 400 K/uL   Neutrophils Relative % 84 %   Neutro Abs 22.2 (H) 1.7 - 7.7 K/uL   Lymphocytes Relative 9 %   Lymphs Abs 2.4 0.7 - 4.0 K/uL   Monocytes Relative 7 %   Monocytes Absolute 1.8 (H) 0.1 - 1.0 K/uL   Eosinophils Relative 0 %   Eosinophils Absolute 0.0 0.0 - 0.7 K/uL   Basophils Relative 0 %   Basophils Absolute 0.0 0.0 - 0.1 K/uL  HIV antibody     Status: None   Collection Time: 12/20/15  6:32 AM   Result Value Ref Range   HIV Screen 4th Generation wRfx Non Reactive Non Reactive    Comment: (NOTE) Performed At: [facility] [address] [place], [place] [id number] Lindon Romp MD PR:[id number]   Glucose, capillary     Status: Abnormal   Collection Time: 12/20/15  8:05 AM  Result Value Ref Range   Glucose-Capillary  116 (H) 65 - 99 mg/dL   Comment 1 Notify RN   Procalcitonin     Status: None   Collection Time: 12/21/15  6:28 AM  Result Value Ref Range   Procalcitonin 2.59 ng/mL    Comment:        Interpretation: PCT > 2 ng/mL: Systemic infection (sepsis) is likely, unless other causes are known. (NOTE)         ICU PCT Algorithm               Non ICU PCT Algorithm    ----------------------------     ------------------------------         PCT < 0.25 ng/mL                 PCT < 0.1 ng/mL     Stopping of antibiotics            Stopping of antibiotics       strongly encouraged.               strongly encouraged.    ----------------------------     ------------------------------       PCT level decrease by               PCT < 0.25 ng/mL       >= 80% from peak PCT       OR PCT 0.25 - 0.5 ng/mL          Stopping of antibiotics                                             encouraged.     Stopping of antibiotics           encouraged.    ----------------------------     ------------------------------       PCT level decrease by              PCT >= 0.25 ng/mL       < 80% from peak PCT        AND PCT >= 0.5 ng/mL            Continuing antibiotics                                               encouraged.       Continuing antibiotics            encouraged.    ----------------------------     ------------------------------     PCT level increase compared          PCT > 0.5 ng/mL         with peak PCT AND          PCT >= 0.5 ng/mL             Escalation of antibiotics                                          strongly encouraged.      Escalation of antibiotics         strongly encouraged.   CBC with Differential/Platelet     Status: Abnormal   Collection  Time: 12/21/15  6:28 AM  Result Value Ref Range   WBC 22.4 (H) 4.0 - 10.5 K/uL   RBC 3.58 (L) 4.22 - 5.81 MIL/uL   Hemoglobin 11.2 (L) 13.0 - 17.0 g/dL   HCT 33.9 (L) 39.0 - 52.0 %   MCV 94.7 78.0 - 100.0 fL   MCH 31.3 26.0 - 34.0 pg   MCHC 33.0 30.0 - 36.0 g/dL   RDW 13.7 11.5 - 15.5 %   Platelets 193 150 - 400 K/uL   Neutrophils Relative % 78 %   Neutro Abs 17.6 (H) 1.7 - 7.7 K/uL   Lymphocytes Relative 14 %   Lymphs Abs 3.1 0.7 - 4.0 K/uL   Monocytes Relative 7 %   Monocytes Absolute 1.5 (H) 0.1 - 1.0 K/uL   Eosinophils Relative 1 %   Eosinophils Absolute 0.1 0.0 - 0.7 K/uL   Basophils Relative 0 %   Basophils Absolute 0.0 0.0 - 0.1 K/uL  Basic metabolic panel     Status: Abnormal   Collection Time: 12/21/15  6:28 AM  Result Value Ref Range   Sodium 143 135 - 145 mmol/L   Potassium 3.6 3.5 - 5.1 mmol/L   Chloride 111 101 - 111 mmol/L   CO2 24 22 - 32 mmol/L   Glucose, Bld 115 (H) 65 - 99 mg/dL   BUN 18 6 - 20 mg/dL   Creatinine, Ser 1.60 (H) 0.61 - 1.24 mg/dL   Calcium 7.8 (L) 8.9 - 10.3 mg/dL   GFR calc non Af Amer 45 (L) >60 mL/min   GFR calc Af Amer 52 (L) >60 mL/min    Comment: (NOTE) The eGFR has been calculated using the CKD EPI equation. This calculation has not been validated in all clinical situations. eGFR's persistently <60 mL/min signify possible Chronic Kidney Disease.    Anion gap 8 5 - 15  Glucose, capillary     Status: Abnormal   Collection Time: 12/21/15  7:52 AM  Result Value Ref Range   Glucose-Capillary 111 (H) 65 - 99 mg/dL   Comment 1 Notify RN     ABGS No results for input(s): PHART, PO2ART, TCO2, HCO3 in the last 72 hours.  Invalid input(s): PCO2 CULTURES Recent Results (from the past 240 hour(s))  Blood culture (routine x 2)     Status: None (Preliminary result)   Collection Time: 12/19/15  4:22 PM  Result Value Ref Range Status   Specimen  Description BLOOD RIGHT ARM  Final   Special Requests BOTTLES DRAWN AEROBIC AND ANAEROBIC 4CC EACH  Final   Culture NO GROWTH < 24 HOURS  Final   Report Status PENDING  Incomplete  Blood culture (routine x 2)     Status: None (Preliminary result)   Collection Time: 12/19/15  4:29 PM  Result Value Ref Range Status   Specimen Description LEFT ANTECUBITAL  Final   Special Requests BOTTLES DRAWN AEROBIC AND ANAEROBIC 8CC EACH  Final   Culture NO GROWTH < 24 HOURS  Final   Report Status PENDING  Incomplete  MRSA PCR Screening     Status: Abnormal   Collection Time: 12/19/15  8:02 PM  Result Value Ref Range Status   MRSA by PCR POSITIVE (A) NEGATIVE Final    Comment:        The GeneXpert MRSA Assay (FDA approved for NASAL specimens only), is one component of a comprehensive MRSA colonization surveillance program. It is not intended to diagnose MRSA infection nor to guide or monitor treatment for  MRSA infections. RESULT CALLED TO, READ BACK BY AND VERIFIED WITH: WILSON,A. AT 2335 ON 12/19/2015 BY AGUNDIZ,E.    Studies/Results: Dg Elbow Complete Left  12/19/2015  CLINICAL DATA:  LEFT elbow pain and swelling.  Symptoms for 1 week. EXAM: LEFT ELBOW - COMPLETE 3+ VIEW COMPARISON:  11/18/2015. FINDINGS: There is no evidence of fracture, dislocation, or joint effusion. There is no evidence of arthropathy. There is a posteriorly projecting exostosis from the olecranon which appears truncated and comparison with the previous radiograph in February. This is associated with marked soft tissue swelling of not only the upper arm but proximal forearm. Olecranon bursitis, with or without infection of the bone, is suspected. IMPRESSION: Cellulitis, olecranon bursitis without effusion, and possible osseous erosion of a posteriorly projecting olecranon spur. Correlate clinically for osteomyelitis. Electronically Signed   By: Staci Righter M.D.   On: 12/19/2015 17:16   Mr Brain Wo Contrast  12/19/2015   CLINICAL DATA:  Weakness EXAM: MRI HEAD WITHOUT CONTRAST TECHNIQUE: Multiplanar, multiecho pulse sequences of the brain and surrounding structures were obtained without intravenous contrast. COMPARISON:  MRI 09/10/2015 FINDINGS: Negative for acute infarct. Marked cerebellar atrophy similar to the prior study. Cerebral hemispheres show normal volume. Periventricular white matter hyperintensity similar to the prior study and likely related to chronic microvascular ischemia. 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 Negative for intracranial hemorrhage. Negative for mass or edema. No shift of the midline structures. Mucosal edema paranasal sinuses. Normal pituitary. Normal skullbase. IMPRESSION: Negative for acute infarct. Marked cerebellar atrophy unchanged from the prior study. This may be related to pharmaceutical side effects. Alcohol and congenital causes also are possible. Chronic ischemic changes as above are stable Mucosal edema paranasal sinuses. Electronically Signed   By: Franchot Gallo M.D.   On: 12/19/2015 15:36   Dg Chest Portable 1 View  12/19/2015  CLINICAL DATA:  Weakness EXAM: PORTABLE CHEST 1 VIEW COMPARISON:  None. FINDINGS: The heart size and mediastinal contours are within normal limits. Both lungs are clear. The visualized skeletal structures are unremarkable. IMPRESSION: No active disease. Electronically Signed   By: Inez Catalina M.D.   On: 12/19/2015 14:37    Medications:  Prior to Admission:  Prescriptions prior to admission  Medication Sig Dispense Refill Last Dose  . amLODipine (NORVASC) 10 MG tablet Take 10 mg by mouth daily.   12/19/2015 at Unknown time  . ammonium lactate (LAC-HYDRIN) 12 % lotion Apply 1 application topically as needed for dry skin.   12/19/2015 at Unknown time  . atenolol (TENORMIN) 100 MG tablet Take 100 mg by mouth daily.   12/19/2015 at 0800  . benztropine (COGENTIN) 1 MG tablet Take 1 mg by mouth 2 (two) times daily.   12/19/2015 at Unknown time   . carbamazepine (TEGRETOL XR) 400 MG 12 hr tablet Take 400 mg by mouth 2 (two) times daily.   12/19/2015 at Unknown time  . furosemide (LASIX) 40 MG tablet Take 40 mg by mouth daily.   12/19/2015 at Unknown time  . lisinopril (PRINIVIL,ZESTRIL) 40 MG tablet Take 40 mg by mouth daily.   12/19/2015 at Unknown time  . OLANZapine (ZYPREXA) 5 MG tablet Take 5 mg by mouth daily.   12/19/2015 at Unknown time  . oxybutynin (DITROPAN) 5 MG tablet Take 5 mg by mouth 2 (two) times daily.   12/19/2015 at Unknown time  . pantoprazole (PROTONIX) 40 MG tablet Take 40 mg by mouth daily.   12/19/2015 at Unknown time  . potassium chloride (K-DUR) 10  MEQ tablet Take 20 mEq by mouth 4 (four) times daily.   12/19/2015 at Unknown time  . aspirin 325 MG tablet Take 1 tablet (325 mg total) by mouth daily. (Patient not taking: Reported on 12/19/2015) 30 tablet 12   . atorvastatin (LIPITOR) 80 MG tablet Take 1 tablet (80 mg total) by mouth daily. (Patient not taking: Reported on 12/19/2015) 30 tablet 12    Scheduled: . amLODipine  10 mg Oral Daily  . atenolol  100 mg Oral Daily  . benztropine  1 mg Oral BID  . carbamazepine  400 mg Oral BID  . cefTRIAXone (ROCEPHIN)  IV  2 g Intravenous Q24H  . Chlorhexidine Gluconate Cloth  6 each Topical Q0600  . heparin  5,000 Units Subcutaneous 3 times per day  . mupirocin ointment  1 application Nasal BID  . OLANZapine  5 mg Oral Daily  . oxybutynin  5 mg Oral BID  . pantoprazole  40 mg Oral Daily  . potassium chloride SA  20 mEq Oral QID  . vancomycin  1,000 mg Intravenous Once   Continuous: . sodium chloride 75 mL/hr at 12/21/15 0416   [id number] **OR** acetaminophen, ammonium lactate, bisacodyl, HYDROcodone-acetaminophen, ondansetron **OR** ondansetron (ZOFRAN) IV, polyethylene glycol  Assesment: He has what looks like cellulitis of his left arm. His white blood count is slowly getting better. He has been seen by Dr. Aline Brochure and I appreciate his help. He still has  fever. Principal Problem:   Generalized weakness Active Problems:   Hypertension   Leukocytosis   CKD (chronic kidney disease) stage 3, GFR 30-59 ml/min   Hypokalemia   Left arm swelling   Episode of generalized weakness   Other malaise and fatigue   Epitrochlear bursitis of left elbow   Cellulitis of left forearm    Plan: He is not ready for discharge yet. Continue IV antibiotics    LOS: 2 days   Triniti Gruetzmacher L 12/21/2015, 11:30 AM

## 2015-12-22 ENCOUNTER — Inpatient Hospital Stay (HOSPITAL_COMMUNITY): Payer: Medicaid Other

## 2015-12-22 LAB — GLUCOSE, CAPILLARY: Glucose-Capillary: 89 mg/dL (ref 65–99)

## 2015-12-22 LAB — CARBAMAZEPINE, FREE AND TOTAL
Carbamazepine, Free: 2.3 ug/mL (ref 0.6–4.2)
Carbamazepine, Total: 9 ug/mL (ref 4.0–12.0)

## 2015-12-22 NOTE — Progress Notes (Signed)
Dr Juanetta GoslingHawkins has placed order for PICC line insertion.  Spoke with Paticia StackJackie Martin, [facility] and confirmed pt able to consent for self.  Will obtain consent and place call for PICC team.

## 2015-12-22 NOTE — Progress Notes (Signed)
Subjective: He says he feels better. He has no new complaints. His breathing is okay.  Objective: Vital signs in last 24 hours: Temp:  [99 F (37.2 C)-99.8 F (37.7 C)] 99 F (37.2 C) (03/27 0639) Pulse Rate:  [88-94] 88 (03/27 0639) Resp:  [16-20] 20 (03/27 0639) BP: (104-110)/(66-72) 110/72 mmHg (03/27 0639) SpO2:  [94 %-97 %] 95 % (03/27 0639) Weight:  [91.2 kg (201 lb 1 oz)] 91.2 kg (201 lb 1 oz) (03/27 0639) Weight change: -1.3 kg (-2 lb 13.9 oz) Last BM Date: 12/19/15  Intake/Output from previous day: 03/26 0701 - 03/27 0700 In: 1200 [P.O.:1200] Out: 4775 [Urine:4775]  PHYSICAL EXAM General appearance: alert, cooperative and no distress Resp: clear to auscultation bilaterally Cardio: regular rate and rhythm, S1, S2 normal, no murmur, click, rub or gallop GI: soft, non-tender; bowel sounds normal; no masses,  no organomegaly Extremities: He still has swelling and erythema of his left arm  Lab Results:  Results for orders placed or performed during the hospital encounter of 12/19/15 (from the past 48 hour(s))  Procalcitonin     Status: None   Collection Time: 12/21/15  6:28 AM  Result Value Ref Range   Procalcitonin 2.59 ng/mL    Comment:        Interpretation: PCT > 2 ng/mL: Systemic infection (sepsis) is likely, unless other causes are known. (NOTE)         ICU PCT Algorithm               Non ICU PCT Algorithm    ----------------------------     ------------------------------         PCT < 0.25 ng/mL                 PCT < 0.1 ng/mL     Stopping of antibiotics            Stopping of antibiotics       strongly encouraged.               strongly encouraged.    ----------------------------     ------------------------------       PCT level decrease by               PCT < 0.25 ng/mL       >= 80% from peak PCT       OR PCT 0.25 - 0.5 ng/mL          Stopping of antibiotics                                             encouraged.     Stopping of antibiotics            encouraged.    ----------------------------     ------------------------------       PCT level decrease by              PCT >= 0.25 ng/mL       < 80% from peak PCT        AND PCT >= 0.5 ng/mL            Continuing antibiotics  encouraged.       Continuing antibiotics            encouraged.    ----------------------------     ------------------------------     PCT level increase compared          PCT > 0.5 ng/mL         with peak PCT AND          PCT >= 0.5 ng/mL             Escalation of antibiotics                                          strongly encouraged.      Escalation of antibiotics        strongly encouraged.   CBC with Differential/Platelet     Status: Abnormal   Collection Time: 12/21/15  6:28 AM  Result Value Ref Range   WBC 22.4 (H) 4.0 - 10.5 K/uL   RBC 3.58 (L) 4.22 - 5.81 MIL/uL   Hemoglobin 11.2 (L) 13.0 - 17.0 g/dL   HCT 33.9 (L) 39.0 - 52.0 %   MCV 94.7 78.0 - 100.0 fL   MCH 31.3 26.0 - 34.0 pg   MCHC 33.0 30.0 - 36.0 g/dL   RDW 13.7 11.5 - 15.5 %   Platelets 193 150 - 400 K/uL   Neutrophils Relative % 78 %   Neutro Abs 17.6 (H) 1.7 - 7.7 K/uL   Lymphocytes Relative 14 %   Lymphs Abs 3.1 0.7 - 4.0 K/uL   Monocytes Relative 7 %   Monocytes Absolute 1.5 (H) 0.1 - 1.0 K/uL   Eosinophils Relative 1 %   Eosinophils Absolute 0.1 0.0 - 0.7 K/uL   Basophils Relative 0 %   Basophils Absolute 0.0 0.0 - 0.1 K/uL  Basic metabolic panel     Status: Abnormal   Collection Time: 12/21/15  6:28 AM  Result Value Ref Range   Sodium 143 135 - 145 mmol/L   Potassium 3.6 3.5 - 5.1 mmol/L   Chloride 111 101 - 111 mmol/L   CO2 24 22 - 32 mmol/L   Glucose, Bld 115 (H) 65 - 99 mg/dL   BUN 18 6 - 20 mg/dL   Creatinine, Ser 1.60 (H) 0.61 - 1.24 mg/dL   Calcium 7.8 (L) 8.9 - 10.3 mg/dL   GFR calc non Af Amer 45 (L) >60 mL/min   GFR calc Af Amer 52 (L) >60 mL/min    Comment: (NOTE) The eGFR has been calculated using the CKD EPI  equation. This calculation has not been validated in all clinical situations. eGFR's persistently <60 mL/min signify possible Chronic Kidney Disease.    Anion gap 8 5 - 15  Glucose, capillary     Status: Abnormal   Collection Time: 12/21/15  7:52 AM  Result Value Ref Range   Glucose-Capillary 111 (H) 65 - 99 mg/dL   Comment 1 Notify RN   Glucose, capillary     Status: None   Collection Time: 12/22/15  7:24 AM  Result Value Ref Range   Glucose-Capillary 89 65 - 99 mg/dL   Comment 1 Notify RN     ABGS No results for input(s): PHART, PO2ART, TCO2, HCO3 in the last 72 hours.  Invalid input(s): PCO2 CULTURES Recent Results (from the past 240 hour(s))  Blood culture (routine x 2)       Status: None (Preliminary result)   Collection Time: 12/19/15  4:22 PM  Result Value Ref Range Status   Specimen Description BLOOD RIGHT ARM  Final   Special Requests BOTTLES DRAWN AEROBIC AND ANAEROBIC 4CC EACH  Final   Culture NO GROWTH < 24 HOURS  Final   Report Status PENDING  Incomplete  Blood culture (routine x 2)     Status: None (Preliminary result)   Collection Time: 12/19/15  4:29 PM  Result Value Ref Range Status   Specimen Description LEFT ANTECUBITAL  Final   Special Requests BOTTLES DRAWN AEROBIC AND ANAEROBIC 8CC EACH  Final   Culture NO GROWTH < 24 HOURS  Final   Report Status PENDING  Incomplete  MRSA PCR Screening     Status: Abnormal   Collection Time: 12/19/15  8:02 PM  Result Value Ref Range Status   MRSA by PCR POSITIVE (A) NEGATIVE Final    Comment:        The GeneXpert MRSA Assay (FDA approved for NASAL specimens only), is one component of a comprehensive MRSA colonization surveillance program. It is not intended to diagnose MRSA infection nor to guide or monitor treatment for MRSA infections. RESULT CALLED TO, READ BACK BY AND VERIFIED WITH: WILSON,A. AT 2335 ON 12/19/2015 BY AGUNDIZ,E.    Studies/Results: No results found.  Medications:  Prior to Admission:   Prescriptions prior to admission  Medication Sig Dispense Refill Last Dose  . amLODipine (NORVASC) 10 MG tablet Take 10 mg by mouth daily.   12/19/2015 at Unknown time  . ammonium lactate (LAC-HYDRIN) 12 % lotion Apply 1 application topically as needed for dry skin.   12/19/2015 at Unknown time  . atenolol (TENORMIN) 100 MG tablet Take 100 mg by mouth daily.   12/19/2015 at 0800  . benztropine (COGENTIN) 1 MG tablet Take 1 mg by mouth 2 (two) times daily.   12/19/2015 at Unknown time  . carbamazepine (TEGRETOL XR) 400 MG 12 hr tablet Take 400 mg by mouth 2 (two) times daily.   12/19/2015 at Unknown time  . furosemide (LASIX) 40 MG tablet Take 40 mg by mouth daily.   12/19/2015 at Unknown time  . lisinopril (PRINIVIL,ZESTRIL) 40 MG tablet Take 40 mg by mouth daily.   12/19/2015 at Unknown time  . OLANZapine (ZYPREXA) 5 MG tablet Take 5 mg by mouth daily.   12/19/2015 at Unknown time  . oxybutynin (DITROPAN) 5 MG tablet Take 5 mg by mouth 2 (two) times daily.   12/19/2015 at Unknown time  . pantoprazole (PROTONIX) 40 MG tablet Take 40 mg by mouth daily.   12/19/2015 at Unknown time  . potassium chloride (K-DUR) 10 MEQ tablet Take 20 mEq by mouth 4 (four) times daily.   12/19/2015 at Unknown time  . aspirin 325 MG tablet Take 1 tablet (325 mg total) by mouth daily. (Patient not taking: Reported on 12/19/2015) 30 tablet 12   . atorvastatin (LIPITOR) 80 MG tablet Take 1 tablet (80 mg total) by mouth daily. (Patient not taking: Reported on 12/19/2015) 30 tablet 12    Scheduled: . amLODipine  10 mg Oral Daily  . atenolol  100 mg Oral Daily  . benztropine  1 mg Oral BID  . carbamazepine  400 mg Oral BID  . cefTRIAXone (ROCEPHIN)  IV  2 g Intravenous Q24H  . Chlorhexidine Gluconate Cloth  6 each Topical Q0600  . heparin  5,000 Units Subcutaneous 3 times per day  . mupirocin ointment  1 application Nasal BID  .  OLANZapine  5 mg Oral Daily  . oxybutynin  5 mg Oral BID  . pantoprazole  40 mg Oral Daily  .  potassium chloride SA  20 mEq Oral QID  . vancomycin  1,000 mg Intravenous Once   Continuous: . sodium chloride Stopped (12/22/15 0550)   [id number] **OR** acetaminophen, ammonium lactate, bisacodyl, HYDROcodone-acetaminophen, ondansetron **OR** ondansetron (ZOFRAN) IV, polyethylene glycol  Assesment: He was admitted with generalized weakness and has cellulitis of his arm. He has lost IV access. He has chronic kidney disease that is stable and somewhat improved today. Principal Problem:   Generalized weakness Active Problems:   Hypertension   Leukocytosis   CKD (chronic kidney disease) stage 3, GFR 30-59 ml/min   Hypokalemia   Left arm swelling   Episode of generalized weakness   Other malaise and fatigue   Epitrochlear bursitis of left elbow   Cellulitis of left forearm    Plan: I will request PICC line. Continue other treatments.    LOS: 3 days   Lynnette Pote L 12/22/2015, 8:49 AM

## 2015-12-22 NOTE — Progress Notes (Signed)
[id number] Patient accidentally pulled his IV catheter out. Attempted x 3 times to gain new IV access w/o success. Patient is a hard stick with limited access to RIGHT arm d/t LEFT arm edema. [facility]) notified and asked to try to gain IV access.

## 2015-12-22 NOTE — Progress Notes (Signed)
IV access lost early AM. Multiple attempts unsuccessful.  Dr Juanetta GoslingHawkins made aware.  Will continue to monitor pt.

## 2015-12-23 LAB — PROCALCITONIN: PROCALCITONIN: 1 ng/mL

## 2015-12-23 LAB — GLUCOSE, CAPILLARY: Glucose-Capillary: 70 mg/dL (ref 65–99)

## 2015-12-23 NOTE — Progress Notes (Addendum)
Subjective: He says he feels better. He still has some swelling of his left arm and his arm is still tender at the elbow  Objective: Vital signs in last 24 hours: Temp:  [98.1 F (36.7 C)-99 F (37.2 C)] 99 F (37.2 C) (03/28 0500) Pulse Rate:  [81-89] 88 (03/28 0500) Resp:  [18-20] 20 (03/28 0500) BP: (108-137)/(72-77) 110/73 mmHg (03/28 0500) SpO2:  [94 %-96 %] 96 % (03/28 0500) Weight:  [91.8 kg (202 lb 6.1 oz)] 91.8 kg (202 lb 6.1 oz) (03/28 0500) Weight change: 0.6 kg (1 lb 5.2 oz) Last BM Date: 12/19/15  Intake/Output from previous day: 03/27 0701 - 03/28 0700 In: 960 [P.O.:960] Out: 4850 [Urine:4850]  PHYSICAL EXAM General appearance: alert, cooperative and no distress Resp: clear to auscultation bilaterally Cardio: regular rate and rhythm, S1, S2 normal, no murmur, click, rub or gallop GI: soft, non-tender; bowel sounds normal; no masses,  no organomegaly Extremities: He still has swelling of his left arm at the elbow and still has some erythema and warmth  Lab Results:  Results for orders placed or performed during the hospital encounter of 12/19/15 (from the past 48 hour(s))  Glucose, capillary     Status: None   Collection Time: 12/22/15  7:24 AM  Result Value Ref Range   Glucose-Capillary 89 65 - 99 mg/dL   Comment 1 Notify RN   Procalcitonin     Status: None   Collection Time: 12/23/15  5:07 AM  Result Value Ref Range   Procalcitonin 1.00 ng/mL  Glucose, capillary     Status: None   Collection Time: 12/23/15  7:43 AM  Result Value Ref Range   Glucose-Capillary 70 65 - 99 mg/dL    ABGS No results for input(s): PHART, PO2ART, TCO2, HCO3 in the last 72 hours.  Invalid input(s): PCO2 CULTURES Recent Results (from the past 240 hour(s))  Blood culture (routine x 2)     Status: None (Preliminary result)   Collection Time: 12/19/15  4:22 PM  Result Value Ref Range Status   Specimen Description BLOOD RIGHT ARM  Final   Special Requests BOTTLES DRAWN  AEROBIC AND ANAEROBIC 4CC EACH  Final   Culture NO GROWTH 3 DAYS  Final   Report Status PENDING  Incomplete  Blood culture (routine x 2)     Status: None (Preliminary result)   Collection Time: 12/19/15  4:29 PM  Result Value Ref Range Status   Specimen Description LEFT ANTECUBITAL  Final   Special Requests BOTTLES DRAWN AEROBIC AND ANAEROBIC 8CC EACH  Final   Culture NO GROWTH 3 DAYS  Final   Report Status PENDING  Incomplete  MRSA PCR Screening     Status: Abnormal   Collection Time: 12/19/15  8:02 PM  Result Value Ref Range Status   MRSA by PCR POSITIVE (A) NEGATIVE Final    Comment:        The GeneXpert MRSA Assay (FDA approved for NASAL specimens only), is one component of a comprehensive MRSA colonization surveillance program. It is not intended to diagnose MRSA infection nor to guide or monitor treatment for MRSA infections. RESULT CALLED TO, READ BACK BY AND VERIFIED WITH: WILSON,A. AT 2335 ON 12/19/2015 BY AGUNDIZ,E.    Studies/Results: Mr Elbow Left Wo Contrast  12/22/2015  CLINICAL DATA:  Diffuse elbow pain and swelling for 1 week. No known injury. EXAM: MRI OF THE LEFT ELBOW WITHOUT CONTRAST TECHNIQUE: Multiplanar, multisequence MR imaging of the elbow was performed. No intravenous contrast was administered. COMPARISON:  Radiographs 12/19/2015 FINDINGS: Exam is somewhat limited by body habitus, motion artifact and lack of IV contrast. There is diffuse and severe cellulitis with marked edema/ inflammation and fluid in the subcutaneous fat. No discrete fluid collection to suggest a drainable abscess. There is also underlying myofasciitis mainly involving the posterior compartment musculature. No definite findings for pyomyositis. No evidence of septic arthritis or osteomyelitis. IMPRESSION: Significant and diffuse cellulitis without obvious drainable abscess. Mild myositis. No septic arthritis or osteomyelitis. Electronically Signed   By: Rudie Meyer M.D.   On: 12/22/2015  10:19    Medications:  Prior to Admission:  Prescriptions prior to admission  Medication Sig Dispense Refill Last Dose  . amLODipine (NORVASC) 10 MG tablet Take 10 mg by mouth daily.   12/19/2015 at Unknown time  . ammonium lactate (LAC-HYDRIN) 12 % lotion Apply 1 application topically as needed for dry skin.   12/19/2015 at Unknown time  . atenolol (TENORMIN) 100 MG tablet Take 100 mg by mouth daily.   12/19/2015 at 0800  . benztropine (COGENTIN) 1 MG tablet Take 1 mg by mouth 2 (two) times daily.   12/19/2015 at Unknown time  . carbamazepine (TEGRETOL XR) 400 MG 12 hr tablet Take 400 mg by mouth 2 (two) times daily.   12/19/2015 at Unknown time  . furosemide (LASIX) 40 MG tablet Take 40 mg by mouth daily.   12/19/2015 at Unknown time  . lisinopril (PRINIVIL,ZESTRIL) 40 MG tablet Take 40 mg by mouth daily.   12/19/2015 at Unknown time  . OLANZapine (ZYPREXA) 5 MG tablet Take 5 mg by mouth daily.   12/19/2015 at Unknown time  . oxybutynin (DITROPAN) 5 MG tablet Take 5 mg by mouth 2 (two) times daily.   12/19/2015 at Unknown time  . pantoprazole (PROTONIX) 40 MG tablet Take 40 mg by mouth daily.   12/19/2015 at Unknown time  . potassium chloride (K-DUR) 10 MEQ tablet Take 20 mEq by mouth 4 (four) times daily.   12/19/2015 at Unknown time  . aspirin 325 MG tablet Take 1 tablet (325 mg total) by mouth daily. (Patient not taking: Reported on 12/19/2015) 30 tablet 12   . atorvastatin (LIPITOR) 80 MG tablet Take 1 tablet (80 mg total) by mouth daily. (Patient not taking: Reported on 12/19/2015) 30 tablet 12    Scheduled: . amLODipine  10 mg Oral Daily  . atenolol  100 mg Oral Daily  . benztropine  1 mg Oral BID  . carbamazepine  400 mg Oral BID  . cefTRIAXone (ROCEPHIN)  IV  2 g Intravenous Q24H  . Chlorhexidine Gluconate Cloth  6 each Topical Q0600  . heparin  5,000 Units Subcutaneous 3 times per day  . mupirocin ointment  1 application Nasal BID  . OLANZapine  5 mg Oral Daily  . oxybutynin  5 mg Oral  BID  . pantoprazole  40 mg Oral Daily  . potassium chloride SA  20 mEq Oral QID  . vancomycin  1,000 mg Intravenous Once   Continuous: . sodium chloride 75 mL/hr at 12/23/15 0300   [id number] **OR** acetaminophen, ammonium lactate, bisacodyl, HYDROcodone-acetaminophen, ondansetron **OR** ondansetron (ZOFRAN) IV, polyethylene glycol  Assesment: He was admitted with cellulitis of his left forearm. He is improving slowly. He has no new complaints. He has hypertension which is stable. He has chronic kidney disease which is stable. His MRI showed severe cellulitis Principal Problem:   Generalized weakness Active Problems:   Hypertension   Leukocytosis   CKD (chronic kidney disease) stage  3, GFR 30-59 ml/min   Hypokalemia   Left arm swelling   Episode of generalized weakness   Other malaise and fatigue   Epitrochlear bursitis of left elbow   Cellulitis of left forearm    Plan: Continue medications. Continue IV antibiotics.    LOS: 4 days   Leonte Horrigan L 12/23/2015, 8:37 AM

## 2015-12-23 NOTE — Clinical Social Work Placement (Signed)
   CLINICAL SOCIAL WORK PLACEMENT  NOTE  Date:  12/23/2015  Patient Details  Name: Loreen FreudMatthew E Avera MRN: [id number] Date of Birth: 03/18/1956  Clinical Social Work is seeking post-discharge placement for this patient at the Skilled  Nursing Facility level of care (*CSW will initial, date and re-position this form in  chart as items are completed):  Yes   Patient/family provided with [place] Clinical Social Work Department's list of facilities offering this level of care within the geographic area requested by the patient (or if unable, by the patient's family).  Yes   Patient/family informed of their freedom to choose among providers that offer the needed level of care, that participate in Medicare, Medicaid or managed care program needed by the patient, have an available bed and are willing to accept the patient.  Yes   Patient/family informed of [place]'s ownership interest in [facility] and [facility], as well as of the fact that they are under no obligation to receive care at these facilities.  PASRR submitted to EDS on 12/23/15     PASRR number received on       Existing PASRR number confirmed on       FL2 transmitted to all facilities in geographic area requested by pt/family on 12/23/15     FL2 transmitted to all facilities within larger geographic area on       Patient informed that his/her managed care company has contracts with or will negotiate with certain facilities, including the following:            Patient/family informed of bed offers received.  Patient chooses bed at       Physician recommends and patient chooses bed at      Patient to be transferred to   on  .  Patient to be transferred to facility by       Patient family notified on   of transfer.  Name of family member notified:        PHYSICIAN Please sign FL2     Additional Comment:    _______________________________________________ Raye Sorrowoble, Aeriana Speece N, LCSW 12/23/2015, 1:37 PM

## 2015-12-23 NOTE — NC FL2 (Signed)
[place] MEDICAID FL2 LEVEL OF CARE SCREENING TOOL     IDENTIFICATION  Patient Name: Ryan FreudMatthew E Duling Birthdate: 07/20/1956 Sex: male Admission Date (Current Location): 12/19/2015  [facility]   Facility and Address:  [facility],  [address], [place] [id number]      Provider Number: [id number]  Attending Physician Name and Address:  Kari BaarsEdward Hawkins, MD  Relative Name and Phone Number:       Current Level of Care: Hospital Recommended Level of Care: Skilled Nursing Facility Prior Approval Number:    Date Approved/Denied:   PASRR Number:   pending SS# [id number]  Discharge Plan: SNF    Current Diagnoses: Patient Active Problem List   Diagnosis Date Noted  . Epitrochlear bursitis of left elbow   . Cellulitis of left forearm   . Leukocytosis 12/19/2015  . Generalized weakness 12/19/2015  . CKD (chronic kidney disease) stage 3, GFR 30-59 ml/min 12/19/2015  . Hypokalemia 12/19/2015  . Left arm swelling 12/19/2015  . Episode of generalized weakness 12/19/2015  . Other malaise and fatigue   . Facial droop 09/09/2015  . Hypertension 09/09/2015  . Urinary incontinence 09/09/2015  . Convulsion (HCC) 09/09/2015    Orientation RESPIRATION BLADDER Height & Weight     Self, Situation, Place  Normal Incontinent Weight: 202 lb 6.1 oz (91.8 kg) Height:  5\' 11"  (180.3 cm)  BEHAVIORAL SYMPTOMS/MOOD NEUROLOGICAL BOWEL NUTRITION STATUS  Other (Comment) (calm and cooperative) Convulsions/Seizures (history of sezuires disorder) Continent Diet (regular)  AMBULATORY STATUS COMMUNICATION OF NEEDS Skin   Extensive Assist Verbally Normal                       Personal Care Assistance Level of Assistance  Bathing, Feeding, Dressing Bathing Assistance: Limited assistance Feeding assistance: Limited assistance Dressing Assistance: Limited assistance     Functional Limitations Info  Sight, Hearing, Speech Sight Info:  Adequate Hearing Info: Adequate Speech Info: Impaired    SPECIAL CARE FACTORS FREQUENCY   (IV antibiotics)                    Contractures Contractures Info: Not present    Additional Factors Info  Code Status, Allergies, Psychotropic, Insulin Sliding Scale, Isolation Precautions Code Status Info: Full Allergies Info: NKA Psychotropic Info: Zyprexa, Cogentin, tegretol Insulin Sliding Scale Info: none noted Isolation Precautions Info: on contact: MRSA     Current Medications (12/23/2015):  This is the current hospital active medication list Current Facility-Administered Medications  Medication Dose Route Frequency Provider Last Rate Last Dose  . 0.9 %  sodium chloride infusion   Intravenous Continuous Kari BaarsEdward Hawkins, MD 75 mL/hr at 12/23/15 0300    . acetaminophen (TYLENOL) tablet 650 mg  650 mg Oral Q6H PRN Briscoe Deutscherimothy S Opyd, MD   650 mg at 12/21/15 0416   Or  . acetaminophen (TYLENOL) suppository 650 mg  650 mg Rectal Q6H PRN Briscoe Deutscherimothy S Opyd, MD      . amLODipine (NORVASC) tablet 10 mg  10 mg Oral Daily Briscoe Deutscherimothy S Opyd, MD   10 mg at 12/23/15 1029  . ammonium lactate (LAC-HYDRIN) 12 % lotion 1 application  1 application Topical PRN Lavone Neriimothy S Opyd, MD      . atenolol (TENORMIN) tablet 100 mg  100 mg Oral Daily Briscoe Deutscherimothy S Opyd, MD   100 mg at 12/23/15 1029  . benztropine (COGENTIN) tablet 1 mg  1 mg Oral BID Briscoe Deutscherimothy S Opyd, MD  1 mg at 12/23/15 1030  . bisacodyl (DULCOLAX) EC tablet 5 mg  5 mg Oral Daily PRN Briscoe Deutscher, MD      . carbamazepine (TEGRETOL XR) 12 hr tablet 400 mg  400 mg Oral BID Briscoe Deutscher, MD   400 mg at 12/23/15 1028  . cefTRIAXone (ROCEPHIN) 2 g in dextrose 5 % 50 mL IVPB  2 g Intravenous Q24H Briscoe Deutscher, MD   2 g at 12/22/15 1758  . Chlorhexidine Gluconate Cloth 2 % PADS 6 each  6 each Topical Q0600 Kari Baars, MD   6 each at 12/22/15 0600  . heparin injection 5,000 Units  5,000 Units Subcutaneous 3 times per day Briscoe Deutscher, MD   5,000 Units  at 12/23/15 0515  . HYDROcodone-acetaminophen (NORCO/VICODIN) 5-325 MG per tablet 1-2 tablet  1-2 tablet Oral Q4H PRN Briscoe Deutscher, MD      . mupirocin ointment (BACTROBAN) 2 % 1 application  1 application Nasal BID Kari Baars, MD   1 application at 12/23/15 1032  . OLANZapine (ZYPREXA) tablet 5 mg  5 mg Oral Daily Briscoe Deutscher, MD   5 mg at 12/23/15 1030  . ondansetron (ZOFRAN) tablet 4 mg  4 mg Oral Q6H PRN Briscoe Deutscher, MD       Or  . ondansetron (ZOFRAN) injection 4 mg  4 mg Intravenous Q6H PRN Briscoe Deutscher, MD      . oxybutynin (DITROPAN) tablet 5 mg  5 mg Oral BID Briscoe Deutscher, MD   5 mg at 12/23/15 1030  . pantoprazole (PROTONIX) EC tablet 40 mg  40 mg Oral Daily Briscoe Deutscher, MD   40 mg at 12/23/15 1028  . polyethylene glycol (MIRALAX / GLYCOLAX) packet 17 g  17 g Oral Daily PRN Lavone Neri Opyd, MD      . potassium chloride SA (K-DUR,KLOR-CON) CR tablet 20 mEq  20 mEq Oral QID Briscoe Deutscher, MD   20 mEq at 12/23/15 1028  . vancomycin (VANCOCIN) IVPB 1000 mg/200 mL premix  1,000 mg Intravenous Once Bethann Berkshire, MD         Discharge Medications: Please see discharge summary for a list of discharge medications.  Relevant Imaging Results:  Relevant Lab Results:   Additional Information   Needs IV antibiotics for next 10 days.  [facility] to [facility] once IV antibiotics are complete.  Raye Sorrow, LCSW

## 2015-12-23 NOTE — Clinical Social Work Note (Signed)
Clinical Social Work Assessment  Patient Details  Name: Ryan [facility] MRN: [id number] Date of Birth: 10/11/1955  Date of referral:  12/23/15               Reason for consult:  Facility Placement                Permission sought to share information with:    Permission granted to share information::  Yes, Verbal Permission Granted  Name::        Agency::  [facility]F placement for IV antibiotics  Relationship::  Sister: Ryan DandyMary  and facility:  [facility]  Contact Information:     Housing/Transportation Living arrangements for the past 2 months:  Assisted Living Facility (family care home) Source of Information:  Medical Team, Facility, Other (Comment Required) (sister) Patient Interpreter Needed:  None Criminal Activity/Legal Involvement Pertinent to Current Situation/Hospitalization:  No - Comment as needed Significant Relationships:  Other Family Members Lives with:  Facility Resident Do you feel safe going back to the place where you live?  No (needing higher level of care) Need for family participation in patient care:  Yes (Comment) (help with decision making process)  Care giving concerns:  LCSW spoke with facility. Patient at this time needs long term antibiotics and facility is not equipped to assist with treatment. Patient will need higher level of care. Patient has been at facility since 2008.   Social Worker assessment / plan:  Patient unable to complete assessment, spoke with facility and spoke with patient's sister who makes most decisions for him.  Patient will need higher level of care due to facility unable to manage IV antibiotics. Sister made aware, agreeable and [facility] in effort to be close to him.  LCSW explained role and process regarding SNF. Patient only has medicaid, thus options are limited, but hopeful for a SNF close to area. FL2 updated and Passar has been submitted. LCSW will fax  to facilities and follow up with family care home as well as sister regarding status and placement.  Employment status:  Disabled (Comment on whether or not currently receiving Disability) Insurance information[place]  [place]:  Skilled Nursing Facility Information / Referral to community resources:  Skilled Nursing Facility  Patient/Family's Response to care:  Agreeable with plan  Patient/Family's Understanding of and Emotional Response to Diagnosis, Current Treatment, and Prognosis:  Sister has been to see patient since admitted (on Saturday) she is understanding and reasonable with plans to change level of care due to treatment.  Patient appreciative of help and assistance for her brother.  Emotional Assessment Appearance:  Appears older than stated age Attitude/Demeanor/Rapport:  Lethargic, Guarded Affect (typically observed):  Restless Orientation:  Oriented to Self Alcohol / Substance use:  Not Applicable Psych involvement (Current and /or in the community):  No (Comment)  Discharge Needs  Concerns to be addressed:  Adjustment to Illness Readmission within the last 30 days:  No Current discharge risk:  None Barriers to Discharge:  Continued Medical Work up, Inadequate or no insurance (Medicaid patient)   Cordella RegisterCoble, Ryan Berrett N, LCSW 12/23/2015, 1:21 PM

## 2015-12-24 DIAGNOSIS — F71 Moderate intellectual disabilities: Secondary | ICD-10-CM | POA: Diagnosis present

## 2015-12-24 DIAGNOSIS — F259 Schizoaffective disorder, unspecified: Secondary | ICD-10-CM | POA: Diagnosis present

## 2015-12-24 LAB — URINE CULTURE

## 2015-12-24 LAB — CULTURE, BLOOD (ROUTINE X 2)
CULTURE: NO GROWTH
CULTURE: NO GROWTH

## 2015-12-24 LAB — GLUCOSE, CAPILLARY: Glucose-Capillary: 75 mg/dL (ref 65–99)

## 2015-12-24 MED ORDER — ACETAMINOPHEN 325 MG PO TABS: 650.0000 mg | ORAL_TABLET | Freq: Four times a day (QID) | ORAL | Status: AC | PRN

## 2015-12-24 MED ORDER — POLYETHYLENE GLYCOL 3350 17 G PO PACK: 17.0000 g | PACK | Freq: Every day | ORAL | Status: AC | PRN

## 2015-12-24 MED ORDER — DEXTROSE 5 % IV SOLN: 2.0000 g | INTRAVENOUS | Status: AC

## 2015-12-24 MED ORDER — SODIUM CHLORIDE 0.9 % IV SOLN
1500.0000 mg | INTRAVENOUS | Status: DC
  Administered 2015-12-24 – 2015-12-25 (×2): 1500 mg via INTRAVENOUS
  Filled 2015-12-24 (×3): qty 1500

## 2015-12-24 MED ORDER — HEPARIN SOD (PORK) LOCK FLUSH 100 UNIT/ML IV SOLN: 250.0000 [IU] | INTRAVENOUS | Status: DC | PRN

## 2015-12-24 MED ORDER — VANCOMYCIN HCL IN DEXTROSE 1-5 GM/200ML-% IV SOLN: 1000.0000 mg | Freq: Once | INTRAVENOUS | Status: DC

## 2015-12-24 MED ORDER — HYDROCODONE-ACETAMINOPHEN 5-325 MG PO TABS: 1.0000 | ORAL_TABLET | ORAL | Status: AC | PRN

## 2015-12-24 NOTE — Care Management Note (Signed)
Case Management Note  Patient Details  Name: Ryan [facility] MRN: [id number] Date of Birth: 02/06/1956  Subjective/Objective:                    Action/Plan:Patient is from assisted living [facility].   Will need higher level of care for IVABX therapy. CSW is working on placement.   Expected Discharge Date:  12/22/15               Expected Discharge Plan:  Skilled Nursing Facility  In-House Referral:     Discharge planning Services     Post Acute Care Choice:    Choice offered to:     DME Arranged:    DME Agency:     HH Arranged:    HH Agency:     Status of Service:  In process, will continue to follow  Medicare Important Message Given:    Date Medicare IM Given:    Medicare IM give by:    Date Additional Medicare IM Given:    Additional Medicare Important Message give by:     If discussed at Long Length of Stay Meetings, dates discussed:    Additional Comments:  Adonis HugueninBerkhead, Katerra Ingman L, RN 12/24/2015, 9:18 AM

## 2015-12-24 NOTE — Progress Notes (Signed)
Subjective: He feels better. I think he is going to need a total of at least 10 days of IV antibiotics and this cannot be provided at his assisted living facility so he is going to need a higher level of care at least temporarily  Objective: Vital signs in last 24 hours: Temp:  [98.2 F (36.8 C)-99 F (37.2 C)] 98.9 F (37.2 C) (03/29 0711) Pulse Rate:  [78-88] 78 (03/29 0711) Resp:  [20] 20 (03/29 0711) BP: (119-125)/(60-79) 125/79 mmHg (03/29 0711) SpO2:  [95 %-98 %] 98 % (03/29 0711) Weight:  [91.1 kg (200 lb 13.4 oz)] 91.1 kg (200 lb 13.4 oz) (03/29 0711) Weight change:  Last BM Date: 12/19/15  Intake/Output from previous day: 03/28 0701 - 03/29 0700 In: 5413.8 [P.O.:720; I.V.:4693.8] Out: 4250 [Urine:4250]  PHYSICAL EXAM General appearance: alert, cooperative and mild distress Resp: clear to auscultation bilaterally Cardio: regular rate and rhythm, S1, S2 normal, no murmur, click, rub or gallop GI: soft, non-tender; bowel sounds normal; no masses,  no organomegaly Extremities: His arm is still swollen and erythematous and tender  Lab Results:  Results for orders placed or performed during the hospital encounter of 12/19/15 (from the past 48 hour(s))  Procalcitonin     Status: None   Collection Time: 12/23/15  5:07 AM  Result Value Ref Range   Procalcitonin 1.00 ng/mL  Glucose, capillary     Status: None   Collection Time: 12/23/15  7:43 AM  Result Value Ref Range   Glucose-Capillary 70 65 - 99 mg/dL  Glucose, capillary     Status: None   Collection Time: 12/24/15  7:48 AM  Result Value Ref Range   Glucose-Capillary 75 65 - 99 mg/dL    ABGS No results for input(s): PHART, PO2ART, TCO2, HCO3 in the last 72 hours.  Invalid input(s): PCO2 CULTURES Recent Results (from the past 240 hour(s))  Urine culture     Status: None (Preliminary result)   Collection Time: 12/19/15  2:38 PM  Result Value Ref Range Status   Specimen Description URINE, RANDOM  Final    Special Requests NONE  Final   Culture   Final    >=100,000 COLONIES/mL ENTEROCOCCUS SPECIES Performed at [facility]    Report Status PENDING  Incomplete  Blood culture (routine x 2)     Status: None (Preliminary result)   Collection Time: 12/19/15  4:22 PM  Result Value Ref Range Status   Specimen Description BLOOD RIGHT ARM DRAWN BY RN  Final   Special Requests BOTTLES DRAWN AEROBIC AND ANAEROBIC 4CC EACH  Final   Culture NO GROWTH 4 DAYS  Final   Report Status PENDING  Incomplete  Blood culture (routine x 2)     Status: None (Preliminary result)   Collection Time: 12/19/15  4:29 PM  Result Value Ref Range Status   Specimen Description LEFT ANTECUBITAL  Final   Special Requests BOTTLES DRAWN AEROBIC AND ANAEROBIC 8CC EACH  Final   Culture NO GROWTH 4 DAYS  Final   Report Status PENDING  Incomplete  MRSA PCR Screening     Status: Abnormal   Collection Time: 12/19/15  8:02 PM  Result Value Ref Range Status   MRSA by PCR POSITIVE (A) NEGATIVE Final    Comment:        The GeneXpert MRSA Assay (FDA approved for NASAL specimens only), is one component of a comprehensive MRSA colonization surveillance program. It is not intended to diagnose MRSA infection nor to guide or monitor  treatment for MRSA infections. RESULT CALLED TO, READ BACK BY AND VERIFIED WITH: WILSON,A. AT 2335 ON 12/19/2015 BY AGUNDIZ,E.    Studies/Results: Mr Elbow Left Wo Contrast  12/22/2015  CLINICAL DATA:  Diffuse elbow pain and swelling for 1 week. No known injury. EXAM: MRI OF THE LEFT ELBOW WITHOUT CONTRAST TECHNIQUE: Multiplanar, multisequence MR imaging of the elbow was performed. No intravenous contrast was administered. COMPARISON:  Radiographs 12/19/2015 FINDINGS: Exam is somewhat limited by body habitus, motion artifact and lack of IV contrast. There is diffuse and severe cellulitis with marked edema/ inflammation and fluid in the subcutaneous fat. No discrete fluid collection to suggest a  drainable abscess. There is also underlying myofasciitis mainly involving the posterior compartment musculature. No definite findings for pyomyositis. No evidence of septic arthritis or osteomyelitis. IMPRESSION: Significant and diffuse cellulitis without obvious drainable abscess. Mild myositis. No septic arthritis or osteomyelitis. Electronically Signed   By: Rudie MeyerP.  Gallerani M.D.   On: 12/22/2015 10:19    Medications:  Prior to Admission:  Prescriptions prior to admission  Medication Sig Dispense Refill Last Dose  . amLODipine (NORVASC) 10 MG tablet Take 10 mg by mouth daily.   12/19/2015 at Unknown time  . ammonium lactate (LAC-HYDRIN) 12 % lotion Apply 1 application topically as needed for dry skin.   12/19/2015 at Unknown time  . atenolol (TENORMIN) 100 MG tablet Take 100 mg by mouth daily.   12/19/2015 at 0800  . benztropine (COGENTIN) 1 MG tablet Take 1 mg by mouth 2 (two) times daily.   12/19/2015 at Unknown time  . carbamazepine (TEGRETOL XR) 400 MG 12 hr tablet Take 400 mg by mouth 2 (two) times daily.   12/19/2015 at Unknown time  . furosemide (LASIX) 40 MG tablet Take 40 mg by mouth daily.   12/19/2015 at Unknown time  . lisinopril (PRINIVIL,ZESTRIL) 40 MG tablet Take 40 mg by mouth daily.   12/19/2015 at Unknown time  . OLANZapine (ZYPREXA) 5 MG tablet Take 5 mg by mouth daily.   12/19/2015 at Unknown time  . oxybutynin (DITROPAN) 5 MG tablet Take 5 mg by mouth 2 (two) times daily.   12/19/2015 at Unknown time  . pantoprazole (PROTONIX) 40 MG tablet Take 40 mg by mouth daily.   12/19/2015 at Unknown time  . potassium chloride (K-DUR) 10 MEQ tablet Take 20 mEq by mouth 4 (four) times daily.   12/19/2015 at Unknown time  . aspirin 325 MG tablet Take 1 tablet (325 mg total) by mouth daily. (Patient not taking: Reported on 12/19/2015) 30 tablet 12   . atorvastatin (LIPITOR) 80 MG tablet Take 1 tablet (80 mg total) by mouth daily. (Patient not taking: Reported on 12/19/2015) 30 tablet 12     Scheduled: . amLODipine  10 mg Oral Daily  . atenolol  100 mg Oral Daily  . benztropine  1 mg Oral BID  . carbamazepine  400 mg Oral BID  . cefTRIAXone (ROCEPHIN)  IV  2 g Intravenous Q24H  . Chlorhexidine Gluconate Cloth  6 each Topical Q0600  . heparin  5,000 Units Subcutaneous 3 times per day  . mupirocin ointment  1 application Nasal BID  . OLANZapine  5 mg Oral Daily  . oxybutynin  5 mg Oral BID  . pantoprazole  40 mg Oral Daily  . potassium chloride SA  20 mEq Oral QID  . vancomycin  1,000 mg Intravenous Once   Continuous: . sodium chloride 75 mL/hr at 12/24/15 0737   ZOX:[id number]:acetaminophen **OR** acetaminophen,  ammonium lactate, bisacodyl, HYDROcodone-acetaminophen, ondansetron **OR** ondansetron (ZOFRAN) IV, polyethylene glycol  Assesment: He was admitted with cellulitis of the left forearm. This is severe by MRI. He has generalized weakness from that. He has chronic kidney disease which is stable. Principal Problem:   Generalized weakness Active Problems:   Hypertension   Leukocytosis   CKD (chronic kidney disease) stage 3, GFR 30-59 ml/min   Hypokalemia   Left arm swelling   Episode of generalized weakness   Other malaise and fatigue   Epitrochlear bursitis of left elbow   Cellulitis of left forearm    Plan: Continue treatments. Potential transfer to skilled care facility    LOS: 5 days   Nazia Rhines L 12/24/2015, 8:59 AM

## 2015-12-24 NOTE — Discharge Summary (Signed)
Physician Discharge Summary  Patient ID: Ryan Morton MRN: [id number] DOB/AGE: [birth year] 60 y.o. Primary Care Physician:No primary care provider on file. Admit date: 01/05/2016 Discharge date: 12/24/2015    Discharge Diagnoses:   Principal Problem:   Generalized weakness Active Problems:   Hypertension   Convulsion (HCC)   Leukocytosis   CKD (chronic kidney disease) stage 3, GFR 30-59 ml/min   Hypokalemia   Left arm swelling   Episode of generalized weakness   Other malaise and fatigue   Epitrochlear bursitis of left elbow   Cellulitis of left forearm   Schizoaffective disorder (HCC)   Moderate intellectual disabilities     Medication List    STOP taking these medications        aspirin 325 MG tablet     lisinopril 40 MG tablet  Commonly known as:  PRINIVIL,ZESTRIL      TAKE these medications        acetaminophen 325 MG tablet  Commonly known as:  TYLENOL  Take 2 tablets (650 mg total) by mouth every 6 (six) hours as needed for mild pain (or Fever >/= 101).     amLODipine 10 MG tablet  Commonly known as:  NORVASC  Take 10 mg by mouth daily.     ammonium lactate 12 % lotion  Commonly known as:  LAC-HYDRIN  Apply 1 application topically as needed for dry skin.     atenolol 100 MG tablet  Commonly known as:  TENORMIN  Take 100 mg by mouth daily.     atorvastatin 80 MG tablet  Commonly known as:  LIPITOR  Take 1 tablet (80 mg total) by mouth daily.     benztropine 1 MG tablet  Commonly known as:  COGENTIN  Take 1 mg by mouth 2 (two) times daily.     carbamazepine 400 MG 12 hr tablet  Commonly known as:  TEGRETOL XR  Take 400 mg by mouth 2 (two) times daily.     cefTRIAXone 2 g in dextrose 5 % 50 mL  Inject 2 g into the vein daily.     furosemide 40 MG tablet  Commonly known as:  LASIX  Take 40 mg by mouth daily.     HYDROcodone-acetaminophen 5-325 MG tablet  Commonly known as:  NORCO/VICODIN  Take 1-2 tablets by mouth every 4 (four) hours as  needed for moderate pain.     OLANZapine 5 MG tablet  Commonly known as:  ZYPREXA  Take 5 mg by mouth daily.     oxybutynin 5 MG tablet  Commonly known as:  DITROPAN  Take 5 mg by mouth 2 (two) times daily.     pantoprazole 40 MG tablet  Commonly known as:  PROTONIX  Take 40 mg by mouth daily.     polyethylene glycol packet  Commonly known as:  MIRALAX / GLYCOLAX  Take 17 g by mouth daily as needed for mild constipation.     potassium chloride 10 MEQ tablet  Commonly known as:  K-DUR  Take 20 mEq by mouth 4 (four) times daily.     vancomycin 1 GM/200ML Soln  Commonly known as:  VANCOCIN  Inject 200 mLs (1,000 mg total) into the vein once.        Discharged Condition: Improved    Consults: Orthopedics, Dr. Romeo Apple  Significant Diagnostic Studies: Dg Elbow Complete Left  2016/01/05  CLINICAL DATA:  LEFT elbow pain and swelling.  Symptoms for 1 week. EXAM: LEFT ELBOW - COMPLETE 3+ VIEW COMPARISON:  11/18/2015. FINDINGS: There is no evidence of fracture, dislocation, or joint effusion. There is no evidence of arthropathy. There is a posteriorly projecting exostosis from the olecranon which appears truncated and comparison with the previous radiograph in February. This is associated with marked soft tissue swelling of not only the upper arm but proximal forearm. Olecranon bursitis, with or without infection of the bone, is suspected. IMPRESSION: Cellulitis, olecranon bursitis without effusion, and possible osseous erosion of a posteriorly projecting olecranon spur. Correlate clinically for osteomyelitis. Electronically Signed   By: Elsie StainJohn T Curnes M.D.   On: 12/19/2015 17:16   Mr Brain Wo Contrast  12/19/2015  CLINICAL DATA:  Weakness EXAM: MRI HEAD WITHOUT CONTRAST TECHNIQUE: Multiplanar, multiecho pulse sequences of the brain and surrounding structures were obtained without intravenous contrast. COMPARISON:  MRI 09/10/2015 FINDINGS: Negative for acute infarct. Marked cerebellar  atrophy similar to the prior study. Cerebral hemispheres show normal volume. Periventricular white matter hyperintensity similar to the prior study and likely related to chronic microvascular ischemia. 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 Negative for intracranial hemorrhage. Negative for mass or edema. No shift of the midline structures. Mucosal edema paranasal sinuses. Normal pituitary. Normal skullbase. IMPRESSION: Negative for acute infarct. Marked cerebellar atrophy unchanged from the prior study. This may be related to pharmaceutical side effects. Alcohol and congenital causes also are possible. Chronic ischemic changes as above are stable Mucosal edema paranasal sinuses. Electronically Signed   By: Marlan Palauharles  Clark M.D.   On: 12/19/2015 15:36   Mr Elbow Left Wo Contrast  12/22/2015  CLINICAL DATA:  Diffuse elbow pain and swelling for 1 week. No known injury. EXAM: MRI OF THE LEFT ELBOW WITHOUT CONTRAST TECHNIQUE: Multiplanar, multisequence MR imaging of the elbow was performed. No intravenous contrast was administered. COMPARISON:  Radiographs 12/19/2015 FINDINGS: Exam is somewhat limited by body habitus, motion artifact and lack of IV contrast. There is diffuse and severe cellulitis with marked edema/ inflammation and fluid in the subcutaneous fat. No discrete fluid collection to suggest a drainable abscess. There is also underlying myofasciitis mainly involving the posterior compartment musculature. No definite findings for pyomyositis. No evidence of septic arthritis or osteomyelitis. IMPRESSION: Significant and diffuse cellulitis without obvious drainable abscess. Mild myositis. No septic arthritis or osteomyelitis. Electronically Signed   By: Rudie MeyerP.  Gallerani M.D.   On: 12/22/2015 10:19   Dg Chest Portable 1 View  12/19/2015  CLINICAL DATA:  Weakness EXAM: PORTABLE CHEST 1 VIEW COMPARISON:  None. FINDINGS: The heart size and mediastinal contours are within normal limits. Both lungs are  clear. The visualized skeletal structures are unremarkable. IMPRESSION: No active disease. Electronically Signed   By: Alcide CleverMark  Lukens M.D.   On: 12/19/2015 14:37    Lab Results: Basic Metabolic Panel: No results for input(s): NA, K, CL, CO2, GLUCOSE, BUN, CREATININE, CALCIUM, MG, PHOS in the last 72 hours. Liver Function Tests: No results for input(s): AST, ALT, ALKPHOS, BILITOT, PROT, ALBUMIN in the last 72 hours.   CBC: No results for input(s): WBC, NEUTROABS, HGB, HCT, MCV, PLT in the last 72 hours.  Recent Results (from the past 240 hour(s))  Urine culture     Status: None (Preliminary result)   Collection Time: 12/19/15  2:38 PM  Result Value Ref Range Status   Specimen Description URINE, RANDOM  Final   Special Requests NONE  Final   Culture   Final    >=100,000 COLONIES/mL ENTEROCOCCUS SPECIES [facility]    Report Status PENDING  Incomplete  Blood culture (routine x 2)     Status: None (Preliminary result)   Collection Time: 12/19/15  4:22 PM  Result Value Ref Range Status   Specimen Description BLOOD RIGHT ARM DRAWN BY RN  Final   Special Requests BOTTLES DRAWN AEROBIC AND ANAEROBIC 4CC EACH  Final   Culture NO GROWTH 4 DAYS  Final   Report Status PENDING  Incomplete  Blood culture (routine x 2)     Status: None (Preliminary result)   Collection Time: 12/19/15  4:29 PM  Result Value Ref Range Status   Specimen Description LEFT ANTECUBITAL  Final   Special Requests BOTTLES DRAWN AEROBIC AND ANAEROBIC 8CC EACH  Final   Culture NO GROWTH 4 DAYS  Final   Report Status PENDING  Incomplete  MRSA PCR Screening     Status: Abnormal   Collection Time: 12/19/15  8:02 PM  Result Value Ref Range Status   MRSA by PCR POSITIVE (A) NEGATIVE Final    Comment:        The GeneXpert MRSA Assay (FDA approved for NASAL specimens only), is one component of a comprehensive MRSA colonization surveillance program. It is not intended to diagnose MRSA infection nor  to guide or monitor treatment for MRSA infections. RESULT CALLED TO, READ BACK BY AND VERIFIED WITH: WILSON,A. AT 2335 ON 12/19/2015 BY AGUNDIZ,E.      Hospital Course: This is a 60 year old who lives at an assisted living facility and who developed generalized weakness and malaise. He came to the emergency department and was found to have what looks like cellulitis of his left arm. Orthopedics was consulted and it was felt that he had cellulitis and perhaps some bursitis but nothing that could be drained. He had MRI done that did not show osteomyelitis. His cellulitis was graded as severe. It is felt that he is going to need at least 10 days of IV antibiotics total and this cannot be given at his assisted living facility so is going to need to be transferred to skilled care facility at least temporarily.  Discharge Exam: Blood pressure 125/79, pulse 78, temperature 98.9 F (37.2 C), temperature source Oral, resp. rate 20, height  (1.803 m), weight 91.1 kg (200 lb 13.4 oz), SpO2 98 %. He is awake and alert. He still has swelling and tenderness of his left elbow  Disposition: He has a PICC line in place. He will have 5 more days after transfer to the skilled care facility of Rocephin 2 g IV daily and vancomycin 1000 mg daily. He will need monitoring of vancomycin levels renal function etc. at the skilled care facility. He should be reassessed before the antibiotics are discontinued.      Discharge Instructions    Discharge to SNF when bed available    Complete by:  As directed              Signed: Raylynn Hersh L   12/24/2015, 9:06 AM

## 2015-12-24 NOTE — Progress Notes (Signed)
Dr. Felecia ShellingFanta returned phone call, said to watch bottom lip and to notifiy him if it changes becoming worse.  Will continue to monitor and inform night nurse at shift change.

## 2015-12-24 NOTE — Progress Notes (Signed)
Bottom lip swollen, no change in speech, no respiratory distress, receiving 1st dose of Vancomycin, denies pain to bottom lip.  Dr. Felecia ShellingFanta notified by e-page.

## 2015-12-24 NOTE — Care Management (Signed)
Spoke with Dr Juanetta GoslingHawkins to inform that patient has not been receiving IV Vancomycin since being admitted. Dr Juanetta GoslingHawkins would like to start Vancomycin today with first dose here then patient will be OK for discharge when bed available. Patient will continue with Rocephin and Vanc at facility

## 2015-12-24 NOTE — Progress Notes (Addendum)
12/24/2015  [facility] for [facility]. Sister called and reports she also wants clinicals sent to [facility]  LCSW will follow up with referrals in the morning and if patient accepted to additional facility.  Passar obtained!  [phone]  With dates: 12/24/15- 01/23/16 Updated facility with passar number. Facility still pending review per [facility] at facility.  She reports once reviewed she will follow up with decision. Facility notified of passar obtained.     LCSW received message back from [facility]. Submitting 30 day note, FL2 and progress note outlining his need for SNF placement for IV antibiotics. Awaiting MD to sign 30 day note and then will submit to [facility].  Note has been signed!   [facility].  Pending passar number Patient pending review from [facility] and representative has been updated and given information.  Awaiting call for placement bed.   [facility] has a bed at [facility] and once passar received LCSW will follow up with family care home, sister, and facility regarding disposition. LCSW needs clarification regarding discharge orders for IV antibiotics. Patient has not had any while in hospital and DC summary reflects 5 more days and then 10 days in a progress note.  LCSW will fax 30 day note and have MD addend dc summary.  Ryan EmoryHannah Brittany Amirault LCSW, MSW Clinical Social Work: Emergency Room [phone]

## 2015-12-25 LAB — GLUCOSE, CAPILLARY: GLUCOSE-CAPILLARY: 76 mg/dL (ref 65–99)

## 2015-12-25 MED ORDER — SODIUM CHLORIDE 0.9 % IV SOLN: 1500.0000 mg | INTRAVENOUS | Status: AC

## 2015-12-25 NOTE — Progress Notes (Signed)
Orthopedic progress note patient evaluated  .BP 142/81 mmHg  Pulse 71  Temp(Src) 99.2 F (37.3 C) (Oral)  Resp 18  Ht 5\' 11"  (1.803 m)  Wt 201 lb 1 oz (91.2 kg)  BMI 28.05 kg/m2  SpO2 99% CBC Latest Ref Rng 12/21/2015 12/20/2015 12/19/2015  WBC 4.0 - 10.5 K/uL 22.4(H) 26.4(H) 30.1(H)  Hemoglobin 13.0 - 17.0 g/dL 11.2(L) 13.0 13.8  Hematocrit 39.0 - 52.0 % 33.9(L) 39.8 41.7  Platelets 150 - 400 K/uL 193 203 222     The patient has improved range of motion and decreased swelling in his left elbow.  I recommend he be converted to oral antibiotics

## 2015-12-25 NOTE — Progress Notes (Signed)
Report called to [facility], questions answered, [facility].  [facility].  Stable at discharge with single lumen PICC to RUE flushed and clamped prior to discharge.

## 2015-12-25 NOTE — Progress Notes (Signed)
Subjective: He was admitted with cellulitis of his left arm. It was discovered yesterday that although it was on his medication list he was not actually receiving vancomycin. That was started yesterday. He had some swelling of his lip but it's not clear that it was really related to his vancomycin and that seems to have resolved now. He still complains of swelling and pain in his left arm  Objective: Vital signs in last 24 hours: Temp:  [98.7 F (37.1 C)-99.2 F (37.3 C)] 99.2 F (37.3 C) (03/30 0455) Pulse Rate:  [71-90] 71 (03/30 0455) Resp:  [18-20] 18 (03/30 0455) BP: (114-142)/(76-81) 142/81 mmHg (03/30 0455) SpO2:  [98 %-100 %] 99 % (03/30 0455) Weight:  [91.2 kg (201 lb 1 oz)] 91.2 kg (201 lb 1 oz) (03/30 0455) Weight change:  Last BM Date: 12/19/15  Intake/Output from previous day: 03/29 0701 - 03/30 0700 In: 960 [P.O.:960] Out: 5100 [Urine:5100]  PHYSICAL EXAM General appearance: alert, cooperative and mild distress Resp: clear to auscultation bilaterally Cardio: regular rate and rhythm, S1, S2 normal, no murmur, click, rub or gallop GI: soft, non-tender; bowel sounds normal; no masses,  no organomegaly Extremities: He still has swelling and erythema of his left arm around the elbow but it does look better after vancomycin  Lab Results:  Results for orders placed or performed during the hospital encounter of 12/19/15 (from the past 48 hour(s))  Glucose, capillary     Status: None   Collection Time: 12/24/15  7:48 AM  Result Value Ref Range   Glucose-Capillary 75 65 - 99 mg/dL  Glucose, capillary     Status: None   Collection Time: 12/25/15  7:58 AM  Result Value Ref Range   Glucose-Capillary 76 65 - 99 mg/dL   Comment 1 Notify RN     ABGS No results for input(s): PHART, PO2ART, TCO2, HCO3 in the last 72 hours.  Invalid input(s): PCO2 CULTURES Recent Results (from the past 240 hour(s))  Urine culture     Status: None   Collection Time: 12/19/15  2:38 PM   Result Value Ref Range Status   Specimen Description URINE, RANDOM  Final   Special Requests NONE  Final   Culture   Final    >=100,000 COLONIES/mL ENTEROCOCCUS SPECIES Performed at [facility]    Report Status 12/24/2015 FINAL  Final   Organism ID, Bacteria ENTEROCOCCUS SPECIES  Final      Susceptibility   Enterococcus species - MIC*    AMPICILLIN <=2 SENSITIVE Sensitive     LEVOFLOXACIN 1 SENSITIVE Sensitive     NITROFURANTOIN <=16 SENSITIVE Sensitive     VANCOMYCIN 2 SENSITIVE Sensitive     * >=100,000 COLONIES/mL ENTEROCOCCUS SPECIES  Blood culture (routine x 2)     Status: None   Collection Time: 12/19/15  4:22 PM  Result Value Ref Range Status   Specimen Description BLOOD RIGHT ARM DRAWN BY RN  Final   Special Requests BOTTLES DRAWN AEROBIC AND ANAEROBIC 4CC EACH  Final   Culture NO GROWTH 5 DAYS  Final   Report Status 12/24/2015 FINAL  Final  Blood culture (routine x 2)     Status: None   Collection Time: 12/19/15  4:29 PM  Result Value Ref Range Status   Specimen Description LEFT ANTECUBITAL  Final   Special Requests BOTTLES DRAWN AEROBIC AND ANAEROBIC 8CC EACH  Final   Culture NO GROWTH 5 DAYS  Final   Report Status 12/24/2015 FINAL  Final  MRSA PCR Screening  Status: Abnormal   Collection Time: 12/19/15  8:02 PM  Result Value Ref Range Status   MRSA by PCR POSITIVE (A) NEGATIVE Final    Comment:        The GeneXpert MRSA Assay (FDA approved for NASAL specimens only), is one component of a comprehensive MRSA colonization surveillance program. It is not intended to diagnose MRSA infection nor to guide or monitor treatment for MRSA infections. RESULT CALLED TO, READ BACK BY AND VERIFIED WITH: WILSON,A. AT 2335 ON 12/19/2015 BY AGUNDIZ,E.    Studies/Results: No results found.  Medications:  Prior to Admission:  Prescriptions prior to admission  Medication Sig Dispense Refill Last Dose  . amLODipine (NORVASC) 10 MG tablet Take 10 mg by mouth  daily.   12/19/2015 at Unknown time  . ammonium lactate (LAC-HYDRIN) 12 % lotion Apply 1 application topically as needed for dry skin.   12/19/2015 at Unknown time  . atenolol (TENORMIN) 100 MG tablet Take 100 mg by mouth daily.   12/19/2015 at 0800  . benztropine (COGENTIN) 1 MG tablet Take 1 mg by mouth 2 (two) times daily.   12/19/2015 at Unknown time  . carbamazepine (TEGRETOL XR) 400 MG 12 hr tablet Take 400 mg by mouth 2 (two) times daily.   12/19/2015 at Unknown time  . furosemide (LASIX) 40 MG tablet Take 40 mg by mouth daily.   12/19/2015 at Unknown time  . lisinopril (PRINIVIL,ZESTRIL) 40 MG tablet Take 40 mg by mouth daily.   12/19/2015 at Unknown time  . OLANZapine (ZYPREXA) 5 MG tablet Take 5 mg by mouth daily.   12/19/2015 at Unknown time  . oxybutynin (DITROPAN) 5 MG tablet Take 5 mg by mouth 2 (two) times daily.   12/19/2015 at Unknown time  . pantoprazole (PROTONIX) 40 MG tablet Take 40 mg by mouth daily.   12/19/2015 at Unknown time  . potassium chloride (K-DUR) 10 MEQ tablet Take 20 mEq by mouth 4 (four) times daily.   12/19/2015 at Unknown time  . aspirin 325 MG tablet Take 1 tablet (325 mg total) by mouth daily. (Patient not taking: Reported on 12/19/2015) 30 tablet 12   . atorvastatin (LIPITOR) 80 MG tablet Take 1 tablet (80 mg total) by mouth daily. (Patient not taking: Reported on 12/19/2015) 30 tablet 12    Scheduled: . amLODipine  10 mg Oral Daily  . atenolol  100 mg Oral Daily  . benztropine  1 mg Oral BID  . carbamazepine  400 mg Oral BID  . cefTRIAXone (ROCEPHIN)  IV  2 g Intravenous Q24H  . heparin  5,000 Units Subcutaneous 3 times per day  . OLANZapine  5 mg Oral Daily  . oxybutynin  5 mg Oral BID  . pantoprazole  40 mg Oral Daily  . potassium chloride SA  20 mEq Oral QID  . vancomycin  1,500 mg Intravenous Q24H   Continuous: . sodium chloride 75 mL/hr at 12/24/15 0737   ZOX:[id number] **OR** acetaminophen, ammonium lactate, bisacodyl, heparin lock flush,  HYDROcodone-acetaminophen, ondansetron **OR** ondansetron (ZOFRAN) IV, polyethylene glycol  Assesment: He has generalized weakness that I think is related to his cellulitis which is severe. This is somewhat better.  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 Because he's not been on the vancomycin he may require more like 9 days now of IV treatment in the skilled care facility    LOS: 6 days   Ariyon Mittleman L 12/25/2015, 8:58 AM

## 2015-12-25 NOTE — Discharge Summary (Signed)
Addendum to previous discharge summary. He was not on vancomycin until yesterday. He will need 9 more days of 1.5 gram daily with labs and levels per SNF pharmacy.

## 2015-12-25 NOTE — Progress Notes (Addendum)
1:51 PM Spoke with sister who has reported she would [facility]. [facility]. All clinicals faxed to facility though the [facility]. Awaiting review and confirmation bed is available. [facility]. LCSW to facilitate DC to SNF.  Patient will transfer by EMS and LCSW will arrange. Deretha EmoryHannah Jun Osment LCSW, MSW Clinical Social Work: [employer] [phone]   Patient is scheduled for discharge today and will be placed in a SNF Only [facility] [place]. [facility] confirmed by facility late 3/29.  Sister: Corrie DandyMary was contacted via phone and message left. At 1000am, 12:15pm,  1:00pm LCSW will confirm dc plans and bed choice.  Will follow up with disposition.  Deretha EmoryHannah Vena Bassinger LCSW, MSW

## 2017-01-28 ENCOUNTER — Other Ambulatory Visit (HOSPITAL_COMMUNITY): Payer: Self-pay | Admitting: Pulmonary Disease

## 2017-01-28 ENCOUNTER — Ambulatory Visit (HOSPITAL_COMMUNITY)
Admission: RE | Admit: 2017-01-28 | Discharge: 2017-01-28 | Disposition: A | Discharge status: Home / Self Care | Payer: Medicaid Other | Source: Ambulatory Visit | Attending: Pulmonary Disease | Admitting: Pulmonary Disease

## 2017-01-28 DIAGNOSIS — M25511 Pain in right shoulder: Secondary | ICD-10-CM | POA: Insufficient documentation

## 2017-12-07 IMAGING — DX DG WRIST COMPLETE 3+V*L*
4 series · 4 of 4 positions shown · non-contrast
Comparison: None.

CLINICAL DATA: Pain entire left hand that goes up into shoulder and
neck/pt states he fell but unsure when

EXAM:
LEFT WRIST - COMPLETE 3+ VIEW

[wrist pa]
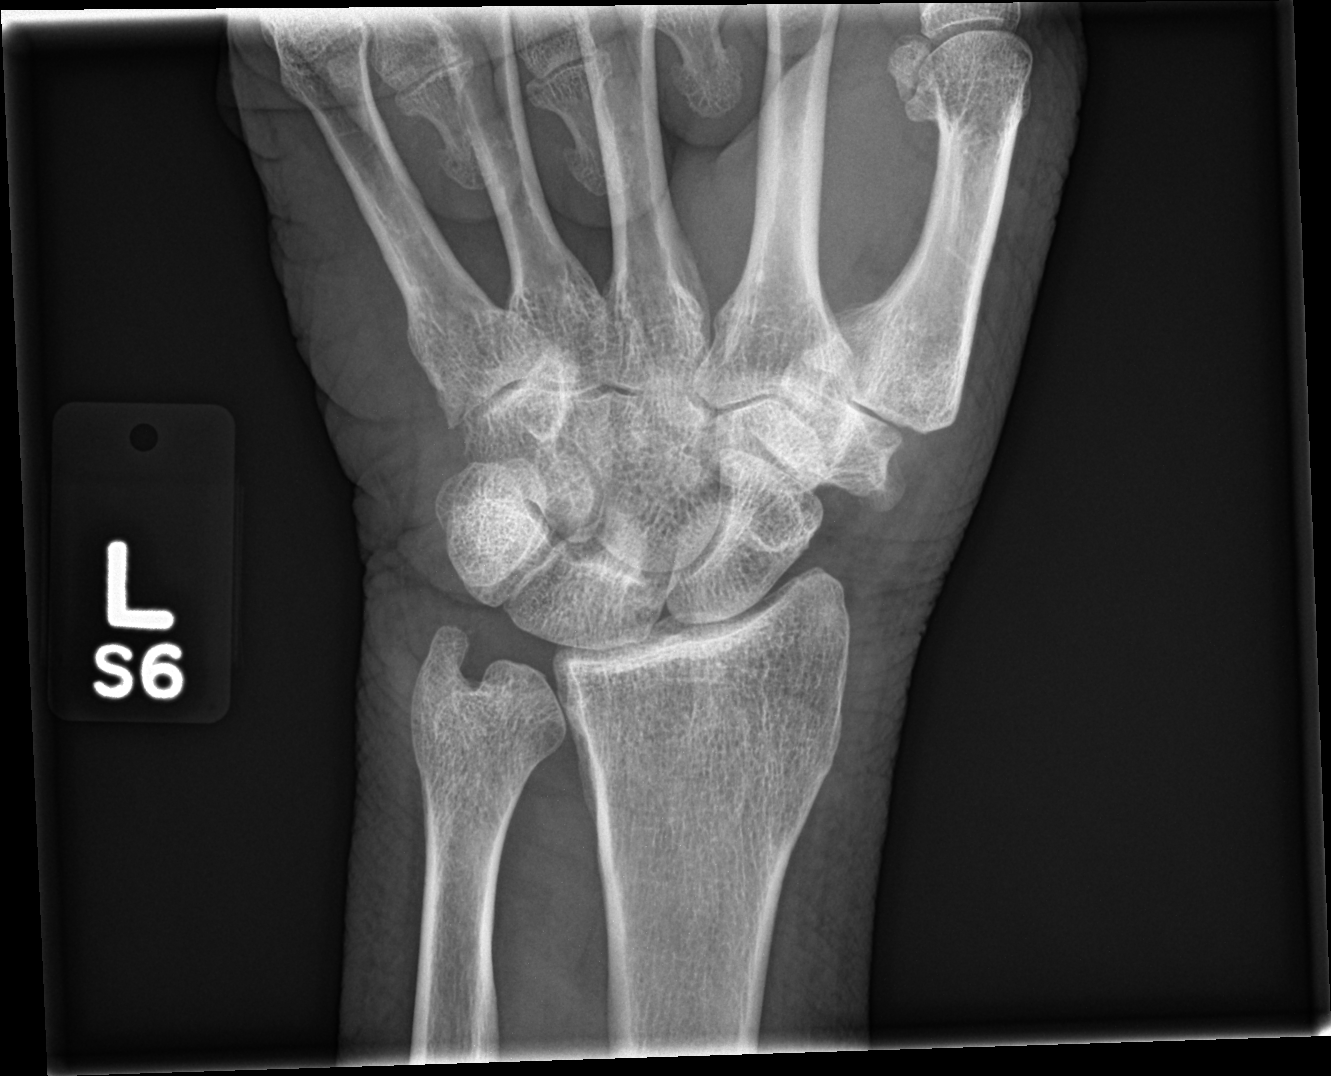

[wrist navicular]
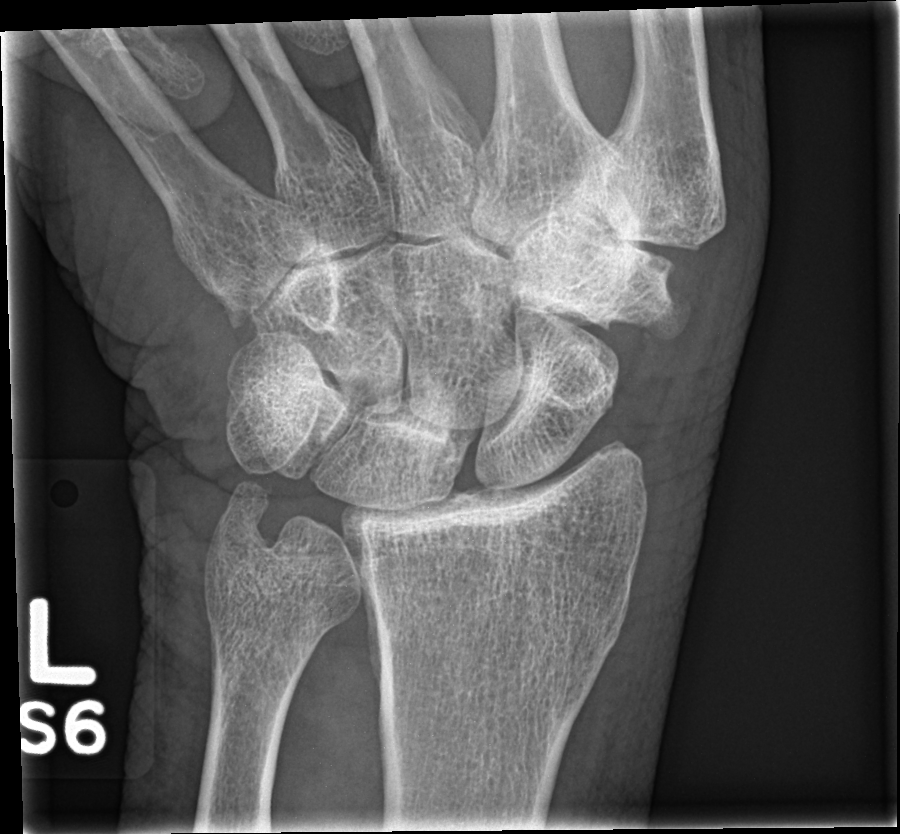

[wrist obl]
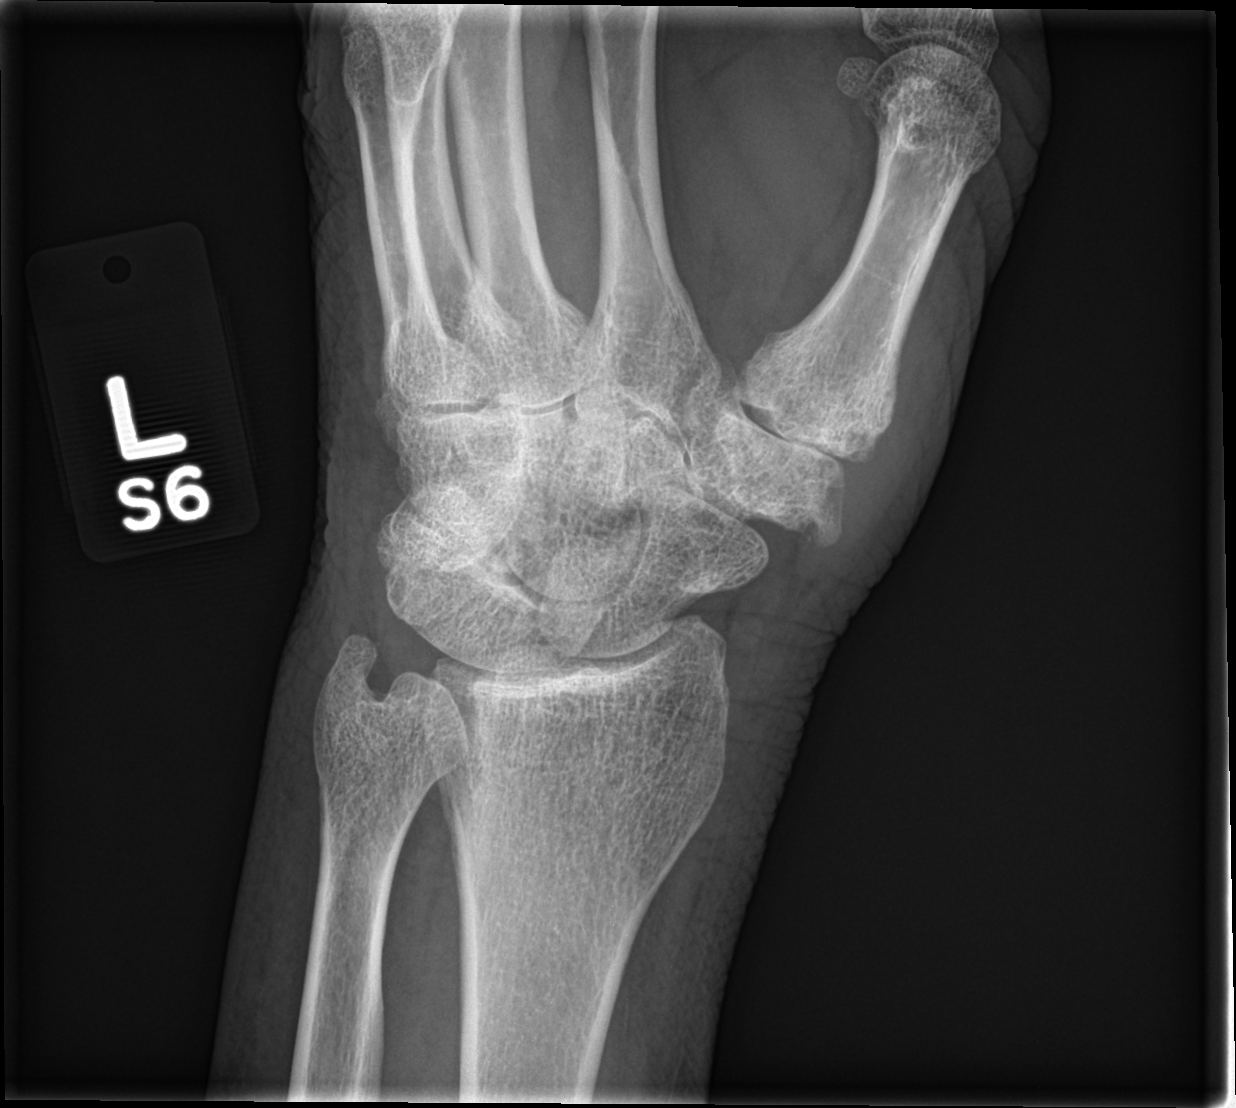

[wrist lat]
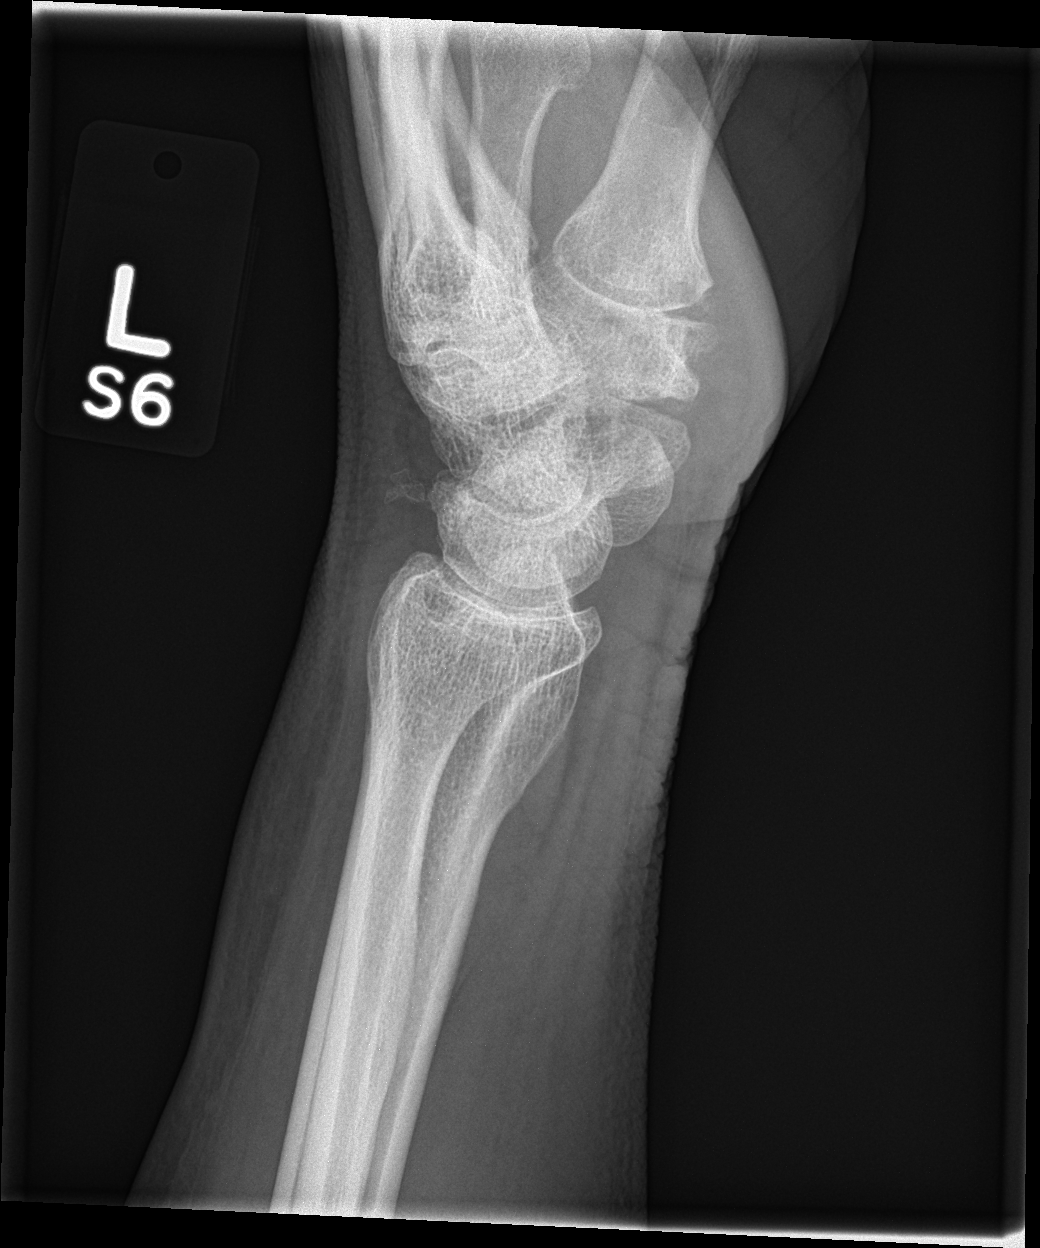

[4 of 4 positions shown; findings below may reference images not displayed]

FINDINGS: Mild arthritic change of the first carpal metacarpal joint.
Corticated loose bodies along the dorsal aspect of the proximal
carpal row likely not of acute origin. No acute fracture or
dislocation.
IMPRESSION: Degenerative changes with no acute findings

## 2017-12-07 IMAGING — DX DG SHOULDER 2+V*L*
3 series · 3 of 3 positions shown · non-contrast
Comparison: None.

CLINICAL DATA: Pain entire left hand that goes up into shoulder and
neck/pt states he fell but unsure when

EXAM:
LEFT SHOULDER - 2+ VIEW

[shoulder ap (1 of 2)]
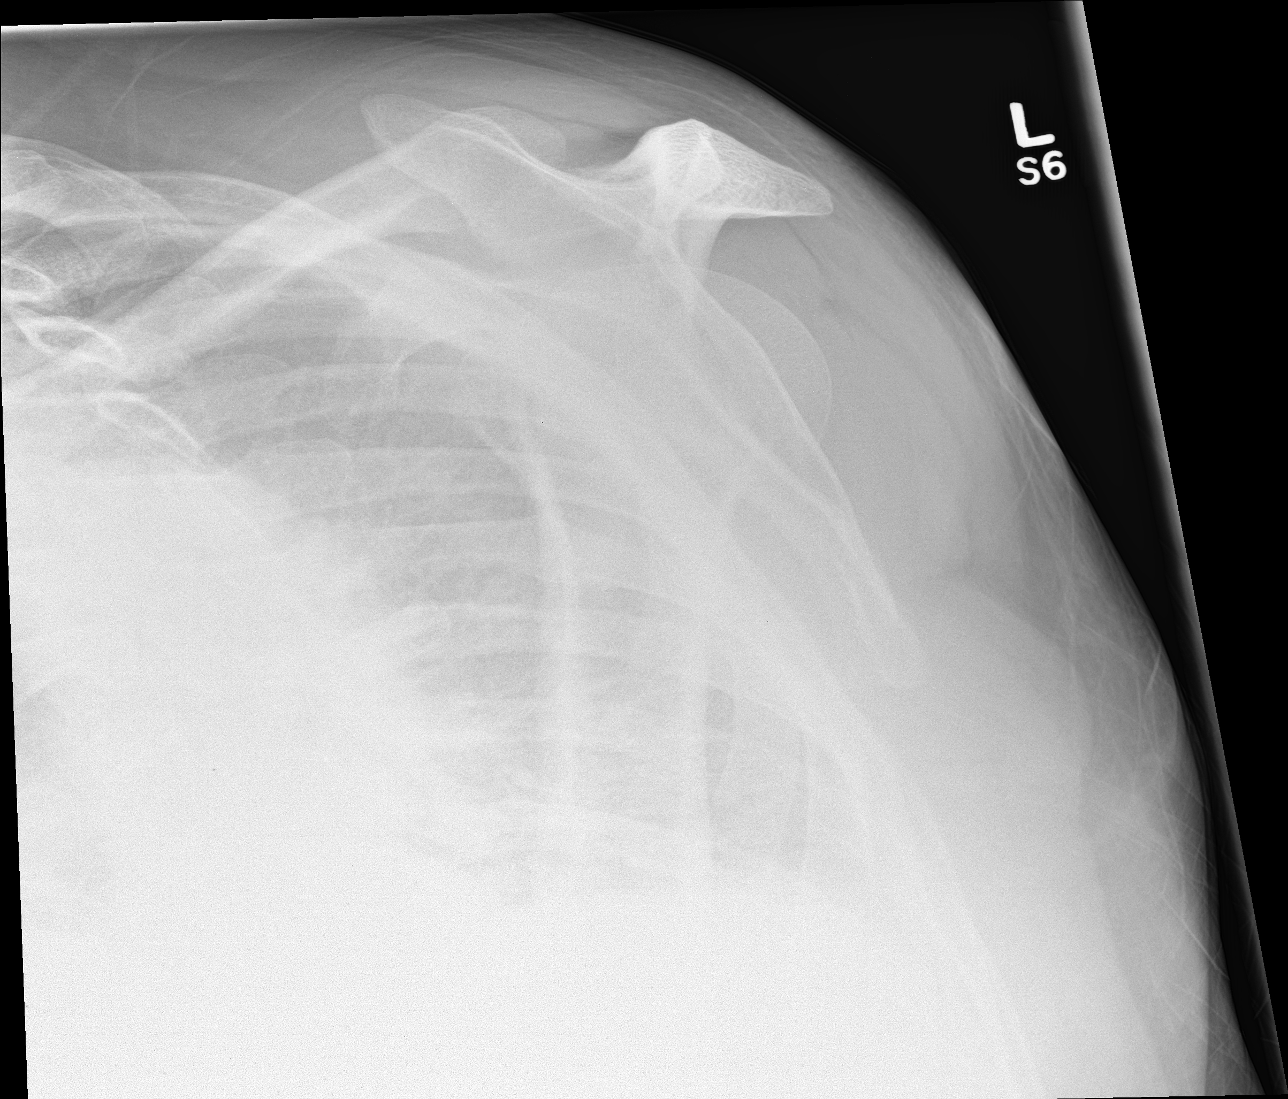

[shoulder ap (2 of 2)]
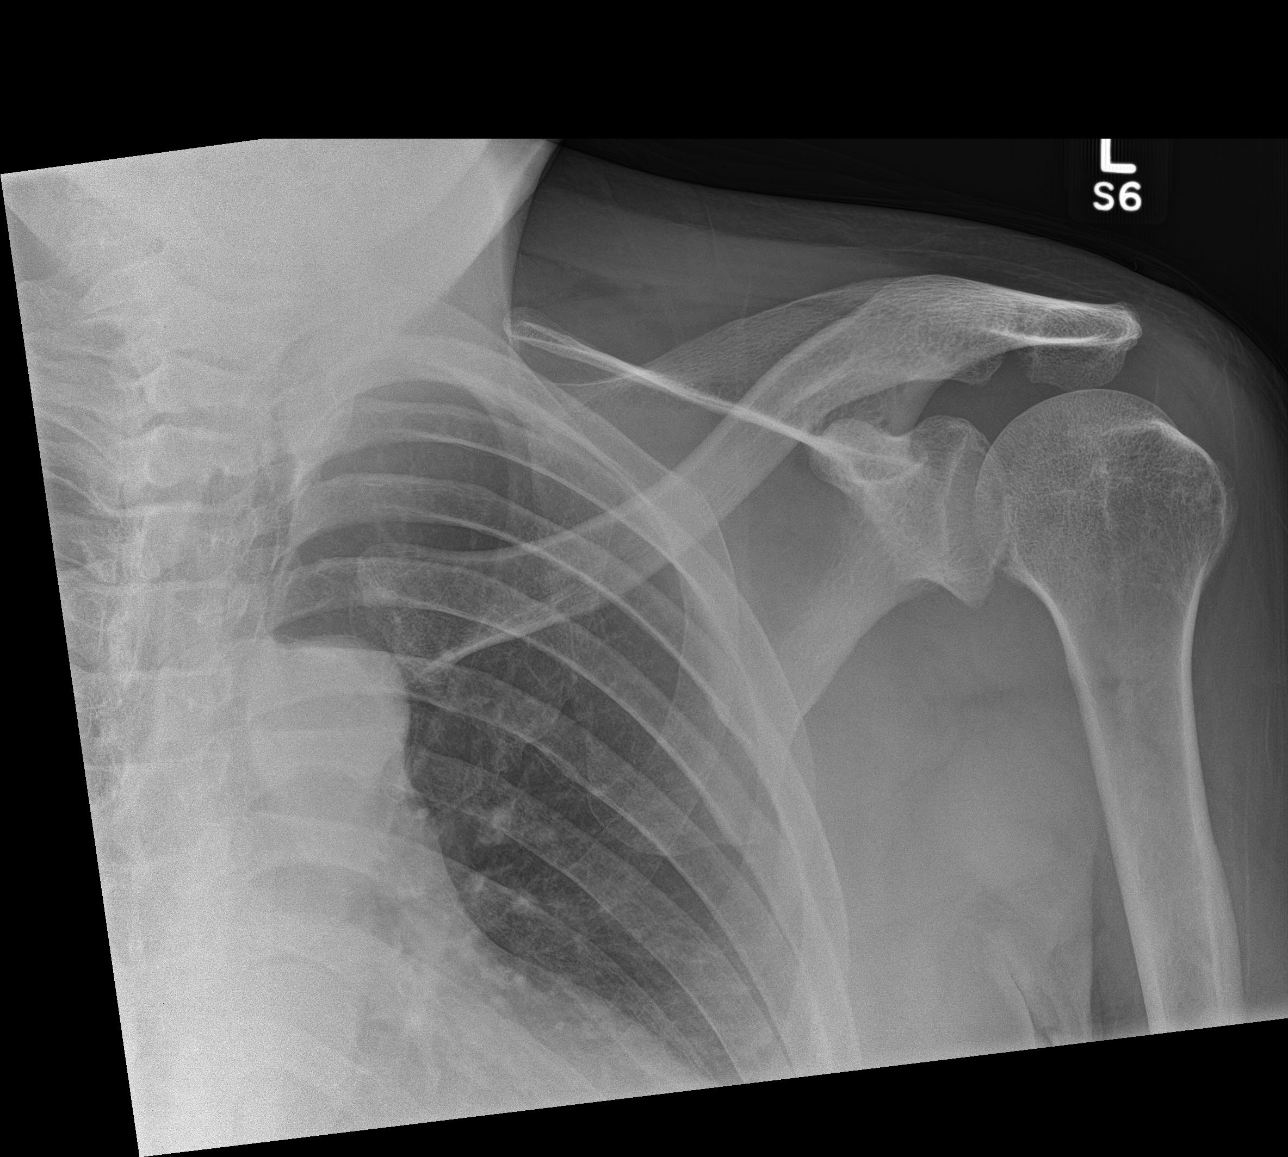

[shoulder axillary]
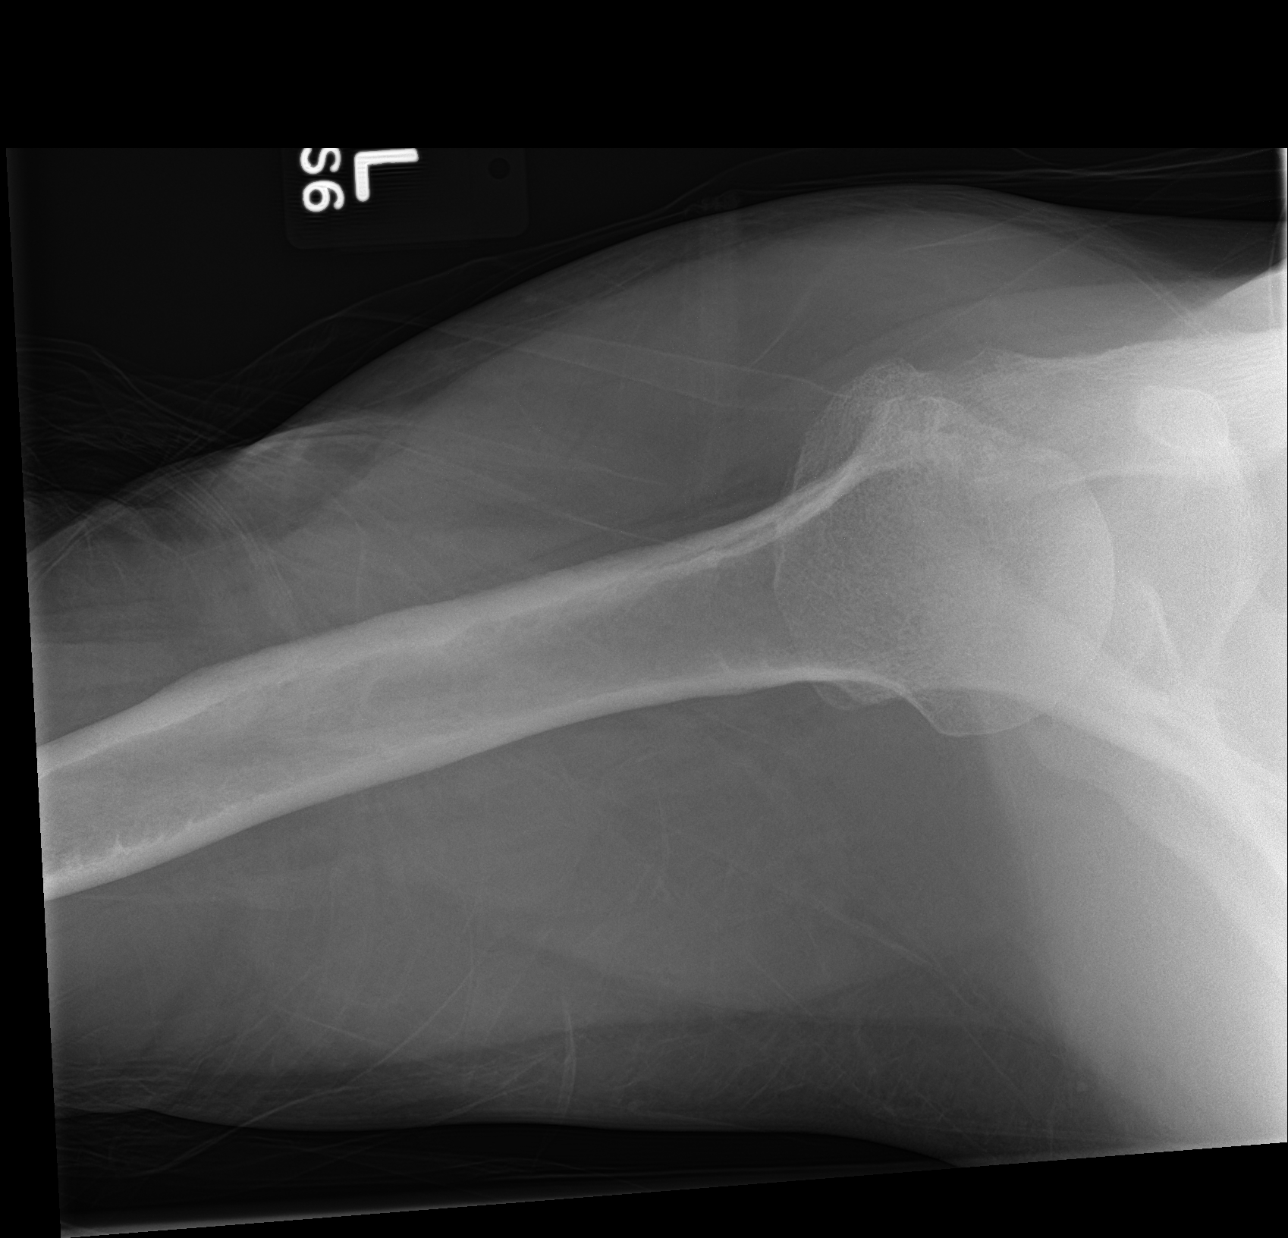

[3 of 3 positions shown; findings below may reference images not displayed]

FINDINGS: Study limited by suboptimal positioning. Allowing for this, no
fracture or dislocation is identified.
IMPRESSION: Limited study shows no acute findings

## 2023-01-26 DEATH — deceased
